# Patient Record
Sex: Female | Born: 1965 | Race: White | Hispanic: No | Marital: Married | Smoking: Former smoker
Health system: Southern US, Community
[De-identification: ages and names within clinical notes are randomized; demographics above are authoritative.]

## PROBLEM LIST (undated history)

## (undated) DIAGNOSIS — T8859XA Other complications of anesthesia, initial encounter: Secondary | ICD-10-CM

## (undated) DIAGNOSIS — G4730 Sleep apnea, unspecified: Secondary | ICD-10-CM

## (undated) DIAGNOSIS — F32A Depression, unspecified: Secondary | ICD-10-CM

## (undated) DIAGNOSIS — H409 Unspecified glaucoma: Secondary | ICD-10-CM

## (undated) DIAGNOSIS — K219 Gastro-esophageal reflux disease without esophagitis: Secondary | ICD-10-CM

## (undated) DIAGNOSIS — F988 Other specified behavioral and emotional disorders with onset usually occurring in childhood and adolescence: Secondary | ICD-10-CM

## (undated) DIAGNOSIS — E7800 Pure hypercholesterolemia, unspecified: Secondary | ICD-10-CM

## (undated) DIAGNOSIS — I10 Essential (primary) hypertension: Secondary | ICD-10-CM

## (undated) DIAGNOSIS — F329 Major depressive disorder, single episode, unspecified: Secondary | ICD-10-CM

## (undated) DIAGNOSIS — T4145XA Adverse effect of unspecified anesthetic, initial encounter: Secondary | ICD-10-CM

## (undated) DIAGNOSIS — R7303 Prediabetes: Secondary | ICD-10-CM

## (undated) DIAGNOSIS — F419 Anxiety disorder, unspecified: Secondary | ICD-10-CM

## (undated) DIAGNOSIS — E039 Hypothyroidism, unspecified: Secondary | ICD-10-CM

## (undated) HISTORY — PX: CHOLECYSTECTOMY: SHX55

## (undated) HISTORY — PX: CARPAL TUNNEL RELEASE: SHX101

---

## 1997-08-04 ENCOUNTER — Other Ambulatory Visit: Admission: RE | Admit: 1997-08-04 | Discharge: 1997-08-04 | Payer: Self-pay | Admitting: Obstetrics and Gynecology

## 1997-09-25 ENCOUNTER — Inpatient Hospital Stay (HOSPITAL_COMMUNITY): Admission: AD | Admit: 1997-09-25 | Discharge: 1997-09-25 | Payer: Self-pay | Admitting: Obstetrics and Gynecology

## 1997-10-16 ENCOUNTER — Inpatient Hospital Stay (HOSPITAL_COMMUNITY): Admission: AD | Admit: 1997-10-16 | Discharge: 1997-10-18 | Payer: Self-pay | Admitting: *Deleted

## 1997-10-16 ENCOUNTER — Inpatient Hospital Stay (HOSPITAL_COMMUNITY): Admission: AD | Admit: 1997-10-16 | Discharge: 1997-10-16 | Payer: Self-pay | Admitting: *Deleted

## 1997-11-22 ENCOUNTER — Other Ambulatory Visit: Admission: RE | Admit: 1997-11-22 | Discharge: 1997-11-22 | Payer: Self-pay | Admitting: Obstetrics and Gynecology

## 1998-03-18 ENCOUNTER — Other Ambulatory Visit: Admission: RE | Admit: 1998-03-18 | Discharge: 1998-03-18 | Payer: Self-pay | Admitting: Obstetrics and Gynecology

## 1998-10-19 ENCOUNTER — Other Ambulatory Visit: Admission: RE | Admit: 1998-10-19 | Discharge: 1998-10-19 | Payer: Self-pay | Admitting: Obstetrics and Gynecology

## 1999-05-04 ENCOUNTER — Other Ambulatory Visit: Admission: RE | Admit: 1999-05-04 | Discharge: 1999-05-04 | Payer: Self-pay | Admitting: Obstetrics and Gynecology

## 1999-09-22 ENCOUNTER — Encounter: Payer: Self-pay | Admitting: Family Medicine

## 1999-09-22 ENCOUNTER — Encounter: Admission: RE | Admit: 1999-09-22 | Discharge: 1999-09-22 | Payer: Self-pay | Admitting: Family Medicine

## 1999-09-29 ENCOUNTER — Encounter (INDEPENDENT_AMBULATORY_CARE_PROVIDER_SITE_OTHER): Payer: Self-pay | Admitting: *Deleted

## 1999-09-29 ENCOUNTER — Ambulatory Visit (HOSPITAL_COMMUNITY): Admission: RE | Admit: 1999-09-29 | Discharge: 1999-09-30 | Payer: Self-pay

## 1999-11-30 ENCOUNTER — Other Ambulatory Visit: Admission: RE | Admit: 1999-11-30 | Discharge: 1999-11-30 | Payer: Self-pay | Admitting: Obstetrics and Gynecology

## 2000-08-01 ENCOUNTER — Encounter: Payer: Self-pay | Admitting: Family Medicine

## 2000-08-01 ENCOUNTER — Encounter: Admission: RE | Admit: 2000-08-01 | Discharge: 2000-08-01 | Payer: Self-pay | Admitting: Family Medicine

## 2000-08-21 ENCOUNTER — Other Ambulatory Visit: Admission: RE | Admit: 2000-08-21 | Discharge: 2000-08-21 | Payer: Self-pay | Admitting: Obstetrics and Gynecology

## 2001-06-19 ENCOUNTER — Other Ambulatory Visit: Admission: RE | Admit: 2001-06-19 | Discharge: 2001-06-19 | Payer: Self-pay | Admitting: Obstetrics and Gynecology

## 2002-12-07 ENCOUNTER — Other Ambulatory Visit: Admission: RE | Admit: 2002-12-07 | Discharge: 2002-12-07 | Payer: Self-pay | Admitting: Obstetrics and Gynecology

## 2003-01-19 ENCOUNTER — Encounter: Admission: RE | Admit: 2003-01-19 | Discharge: 2003-01-19 | Payer: Self-pay | Admitting: Obstetrics and Gynecology

## 2003-01-19 ENCOUNTER — Encounter: Payer: Self-pay | Admitting: Obstetrics and Gynecology

## 2004-02-25 ENCOUNTER — Other Ambulatory Visit: Admission: RE | Admit: 2004-02-25 | Discharge: 2004-02-25 | Payer: Self-pay | Admitting: Obstetrics and Gynecology

## 2005-07-31 ENCOUNTER — Other Ambulatory Visit: Admission: RE | Admit: 2005-07-31 | Discharge: 2005-07-31 | Payer: Self-pay | Admitting: Obstetrics and Gynecology

## 2007-05-11 ENCOUNTER — Emergency Department (HOSPITAL_COMMUNITY): Admission: EM | Admit: 2007-05-11 | Discharge: 2007-05-11 | Payer: Self-pay | Admitting: Emergency Medicine

## 2008-07-26 ENCOUNTER — Other Ambulatory Visit (HOSPITAL_COMMUNITY): Admission: RE | Admit: 2008-07-26 | Discharge: 2008-08-16 | Payer: Self-pay | Admitting: Psychiatry

## 2008-08-03 ENCOUNTER — Ambulatory Visit: Payer: Self-pay | Admitting: Psychiatry

## 2009-07-20 ENCOUNTER — Encounter: Admission: RE | Admit: 2009-07-20 | Discharge: 2009-07-20 | Payer: Self-pay | Admitting: Obstetrics and Gynecology

## 2010-01-24 ENCOUNTER — Encounter: Admission: RE | Admit: 2010-01-24 | Discharge: 2010-01-24 | Payer: Self-pay | Admitting: Obstetrics and Gynecology

## 2010-04-30 ENCOUNTER — Encounter: Payer: Self-pay | Admitting: Obstetrics and Gynecology

## 2010-08-25 NOTE — Op Note (Signed)
Ashley Bright. [facility]  Patient:    Ashley Bright, Ashley Bright                         MRN: [id number] Proc. Date: 09/29/99 Adm. Date:  [id number] Disc. Date: [id number] Attending:  Gennie Bright CC:         Ashley Bright, M.D.                           Operative Report  [facility] [place] NUMBER:  [phone].  PREOPERATIVE DIAGNOSES:  Chronic cholecystitis and cholelithiasis.  POSTOPERATIVE DIAGNOSES:  Chronic cholecystitis, cholelithiasis and umbilical hernia.  OPERATION:  Laparoscopic cholecystectomy, intraoperative cholangiogram and repair of umbilical hernia.  SURGEON:  Milus Mallick, M.D.  ASSISTANT:  Currie Paris, M.D.  ANESTHESIA:  General endotracheal.  DESCRIPTION OF PROCEDURE:  Under adequate general endotracheal anesthesia, the patients abdomen was prepared and draped in the usual fashion.  A short vertical incision was made in the inferior portion of the umbilicus and at that point, one could see that the patient had an umbilical hernia that was chronically incarcerated at the superior end of the incision.  Holding sutures of 0 Vicryl were applied to the fascial edges and the fascia was incised longitudinally and up to the hernia.  The peritoneum was entered and a Hasson cannula was introduced into the abdomen under direct vision.  The abdomen was inflated with the CO2 to a filling pressure of 15 mmHg.  The laparoscopic videoscope was then introduced.  Using this for direct vision, a 10-mm upper midline port was inserted as well as two subcostal 5-mm ports.  The patient was placed in a reversed Trendelenburg position and turned to the left.  The gallbladder was grasped with grasping forceps placed down through the subcostal ports.  It was chronically inflamed and thick-walled and had some adhesions on it.  The adhesions were taken down bluntly, exposing the triangle of Calot.  Dissection was carried in the triangle of Calot and the cystic  duct was isolated and skeletonized.  It was clipped at the cystic duct-gallbladder junction.  A small incision was made in the cystic duct and golden bile exited from it.  A 14-gauge Angiocath was then introduced into the abdomen and a Reddick catheter was introduced through the Angiocath.  It was threaded into the cystic duct and held in place with an Endo Clip.  An intraoperative cholangiogram was then done using 10 cc of 25% Hypaque; this revealed a normal-size common bile duct and no intraluminal filling defects.  There was good flow of dye into the duodenum.  Following the cholangiogram, the clip was removed and the catheter was withdrawn from the cystic duct and removed from the abdomen.  The cystic duct was triply clipped with Endo Clips and transected above the triple-clip application.  The cystic artery was then triply clipped with Endo Clips and transected between the distal two clips. The gallbladder was then dissected out of the gallbladder bed of the liver using hook-and-spatula coagulation.  There was some bleeding from the gallbladder bed and this was controlled with electrocoagulation.  The gallbladder was completely removed.  The subhepatic and subphrenic spaces were irrigated with sterile solution till clear.  The gallbladder was then withdrawn from the abdomen through the umbilical port.  The fundic portion continued to have multiple stones in it and three-quarters of the gallbladder could be removed from the abdomen.  The portion of gallbladder that was outside of the abdomen was incised.  Bile and multiple small stones exited. The stones impacted in the fundus of the gallbladder were extracted and then it was small enough to slip through the umbilical port incision and be removed.  The subcostal ports were then withdrawn and there was no bleeding from the port sites.  The pneumoperitoneum was exhausted and the umbilical hernia was repaired.  The umbilical skin was  dissected off of it and it was excised over hemostats which were ligated with 3-0 Vicryl.  The upper end of the fascial defect was ascertained.  The resulting opening in the fascia was approximately 3 cm in length.  It was closed longitudinally with interrupted sutures of 0 PDS for repair of the umbilical hernia and closure of the port incision.  A pneumoperitoneum was then reinstituted and the undersurface of the umbilical hernia closure was inspected laparoscopically and there were no adhesions to it.  The pneumoperitoneum was once again exhausted and the camera was removed and the upper midline port removed.  The subcuticular layer of the umbilical port incision was closed with a continuous suture of 5-0 Vicryl.  The remaining port incision were closed with interrupted subcuticular sutures of 5-0 Vicryl, half-inch Steri-Strips were applied to the skin and sterile dressings were applied.  Estimated blood loss for the procedure was negligible.  Patient tolerated the procedure well and left the operating room in satisfactory condition. DD:  09/29/99 TD:  10/02/99 Job: [id number]

## 2010-10-03 ENCOUNTER — Other Ambulatory Visit: Payer: Self-pay | Admitting: Obstetrics and Gynecology

## 2010-10-03 DIAGNOSIS — N6320 Unspecified lump in the left breast, unspecified quadrant: Secondary | ICD-10-CM

## 2010-10-13 ENCOUNTER — Ambulatory Visit
Admission: RE | Admit: 2010-10-13 | Discharge: 2010-10-13 | Disposition: A | Discharge status: Home / Self Care | Payer: Managed Care, Other (non HMO) | Source: Ambulatory Visit | Attending: Obstetrics and Gynecology | Admitting: Obstetrics and Gynecology

## 2010-10-13 ENCOUNTER — Other Ambulatory Visit: Payer: Self-pay | Admitting: Obstetrics and Gynecology

## 2010-10-13 DIAGNOSIS — N6320 Unspecified lump in the left breast, unspecified quadrant: Secondary | ICD-10-CM

## 2013-03-25 ENCOUNTER — Other Ambulatory Visit: Payer: Self-pay | Admitting: Obstetrics and Gynecology

## 2013-03-25 DIAGNOSIS — R928 Other abnormal and inconclusive findings on diagnostic imaging of breast: Secondary | ICD-10-CM

## 2013-04-07 ENCOUNTER — Other Ambulatory Visit: Payer: Managed Care, Other (non HMO)

## 2013-04-12 ENCOUNTER — Encounter (HOSPITAL_BASED_OUTPATIENT_CLINIC_OR_DEPARTMENT_OTHER): Payer: Self-pay | Admitting: Emergency Medicine

## 2013-04-12 ENCOUNTER — Emergency Department (HOSPITAL_BASED_OUTPATIENT_CLINIC_OR_DEPARTMENT_OTHER)
Admission: EM | Admit: 2013-04-12 | Discharge: 2013-04-12 | Disposition: A | Discharge status: Home / Self Care | Payer: 59 | Attending: Emergency Medicine | Admitting: Emergency Medicine

## 2013-04-12 DIAGNOSIS — Z79899 Other long term (current) drug therapy: Secondary | ICD-10-CM | POA: Insufficient documentation

## 2013-04-12 DIAGNOSIS — I10 Essential (primary) hypertension: Secondary | ICD-10-CM | POA: Insufficient documentation

## 2013-04-12 DIAGNOSIS — F988 Other specified behavioral and emotional disorders with onset usually occurring in childhood and adolescence: Secondary | ICD-10-CM | POA: Insufficient documentation

## 2013-04-12 DIAGNOSIS — F3289 Other specified depressive episodes: Secondary | ICD-10-CM | POA: Insufficient documentation

## 2013-04-12 DIAGNOSIS — K047 Periapical abscess without sinus: Secondary | ICD-10-CM | POA: Insufficient documentation

## 2013-04-12 DIAGNOSIS — E7800 Pure hypercholesterolemia, unspecified: Secondary | ICD-10-CM | POA: Insufficient documentation

## 2013-04-12 DIAGNOSIS — F17200 Nicotine dependence, unspecified, uncomplicated: Secondary | ICD-10-CM | POA: Insufficient documentation

## 2013-04-12 DIAGNOSIS — F329 Major depressive disorder, single episode, unspecified: Secondary | ICD-10-CM | POA: Insufficient documentation

## 2013-04-12 HISTORY — DX: Essential (primary) hypertension: I10

## 2013-04-12 HISTORY — DX: Pure hypercholesterolemia, unspecified: E78.00

## 2013-04-12 HISTORY — DX: Depression, unspecified: F32.A

## 2013-04-12 HISTORY — DX: Other specified behavioral and emotional disorders with onset usually occurring in childhood and adolescence: F98.8

## 2013-04-12 HISTORY — DX: Major depressive disorder, single episode, unspecified: F32.9

## 2013-04-12 MED ORDER — CLINDAMYCIN PHOSPHATE 900 MG/6ML IJ SOLN
600.0000 mg | Freq: Once | INTRAMUSCULAR | Status: AC
  Administered 2013-04-12: 600 mg via INTRAMUSCULAR
  Filled 2013-04-12: qty 12

## 2013-04-12 MED ORDER — HYDROMORPHONE HCL PF 2 MG/ML IJ SOLN
2.0000 mg | Freq: Once | INTRAMUSCULAR | Status: AC
  Administered 2013-04-12: 2 mg via INTRAMUSCULAR
  Filled 2013-04-12: qty 1

## 2013-04-12 MED ORDER — CLINDAMYCIN PHOSPHATE 900 MG/6ML IJ SOLN
INTRAMUSCULAR | Status: AC
  Filled 2013-04-12: qty 6

## 2013-04-12 MED ORDER — HYDROCODONE-ACETAMINOPHEN 5-325 MG PO TABS: 1.0000 | ORAL_TABLET | Freq: Four times a day (QID) | ORAL | Status: DC | PRN

## 2013-04-12 MED ORDER — CLINDAMYCIN HCL 300 MG PO CAPS: 300.0000 mg | ORAL_CAPSULE | Freq: Four times a day (QID) | ORAL | Status: DC

## 2013-04-12 NOTE — ED Notes (Signed)
No adverse effects noted to IM injection.  

## 2013-04-12 NOTE — Discharge Instructions (Signed)
°  Dental Abscess °A dental abscess is a collection of infected fluid (pus) from a bacterial infection in the inner part of the tooth (pulp). It usually occurs at the end of the tooth's root.  °CAUSES  °· Severe tooth decay. °· Trauma to the tooth that allows bacteria to enter into the pulp, such as a broken or chipped tooth. °SYMPTOMS  °· Severe pain in and around the infected tooth. °· Swelling and redness around the abscessed tooth or in the mouth or face. °· Tenderness. °· Pus drainage. °· Bad breath. °· Bitter taste in the mouth. °· Difficulty swallowing. °· Difficulty opening the mouth. °· Nausea. °· Vomiting. °· Chills. °· Swollen neck glands. °DIAGNOSIS  °· A medical and dental history will be taken. °· An examination will be performed by tapping on the abscessed tooth. °· X-rays may be taken of the tooth to identify the abscess. °TREATMENT °The goal of treatment is to eliminate the infection. You may be prescribed antibiotic medicine to stop the infection from spreading. A root canal may be performed to save the tooth. If the tooth cannot be saved, it may be pulled (extracted) and the abscess may be drained.  °HOME CARE INSTRUCTIONS °· Only take over-the-counter or prescription medicines for pain, fever, or discomfort as directed by your caregiver. °· Rinse your mouth (gargle) often with salt water (¼ tsp salt in 8 oz [250 ml] of warm water) to relieve pain or swelling. °· Do not drive after taking pain medicine (narcotics). °· Do not apply heat to the outside of your face. °· Return to your dentist for further treatment as directed. °SEEK MEDICAL CARE IF: °· Your pain is not helped by medicine. °· Your pain is getting worse instead of better. °SEEK IMMEDIATE MEDICAL CARE IF: °· You have a fever or persistent symptoms for more than 2 3 days. °· You have a fever and your symptoms suddenly get worse. °· You have chills or a very bad headache. °· You have problems breathing or swallowing. °· You have trouble  opening your mouth. °· You have swelling in the neck or around the eye. °Document Released: 03/26/2005 Document Revised: 12/19/2011 Document Reviewed: 07/04/2010 °ExitCare® Patient Information ©2014 [employer]. ° ° °

## 2013-04-12 NOTE — ED Provider Notes (Signed)
CSN: [id number]     Arrival date & time 04/12/13  0129 History   First MD Initiated Contact with Patient 04/12/13 [phone]     Chief Complaint  Patient presents with  . Dental Pain   (Consider location/radiation/quality/duration/timing/severity/associated sxs/prior Treatment) HPI This is a 48 year old female with a five-day history of pain in her left lower first molar. She had been treating it with over-the-counter analgesics without relief. Yesterday she developed swelling associated with that tooth and her left cheek is now significantly swollen and tender. There is moderate to severe pain, worse with palpation or movement of the jaw. She is not aware of having a fever but has had some malaise. She has a dentist whom she will contact tomorrow morning. She's not having any difficulty breathing or swallowing.  Past Medical History  Diagnosis Date  . Hypertension   . Depression   . ADD (attention deficit disorder)   . High cholesterol    Past Surgical History  Procedure Laterality Date  . Cholecystectomy     No family history on file. History  Substance Use Topics  . Smoking status: Current Every Day Smoker  . Smokeless tobacco: Not on file  . Alcohol Use: No   OB History   Grav Para Term Preterm Abortions TAB SAB Ect Mult Living                 Review of Systems  All other systems reviewed and are negative.    Allergies  Review of patient's allergies indicates no known allergies.  Home Medications   Current Outpatient Rx  Name  Route  Sig  Dispense  Refill  . ALPRAZolam (XANAX) 1 MG tablet   Oral   Take 1 mg by mouth 3 (three) times daily as needed for anxiety.         Marland Kitchen amphetamine-dextroamphetamine (ADDERALL) 30 MG tablet   Oral   Take 30 mg by mouth daily.         . ARIPiprazole (ABILIFY) 2 MG tablet   Oral   Take 2 mg by mouth daily.         . BuPROPion HCl (WELLBUTRIN PO)   Oral   Take by mouth.         . hydrochlorothiazide (HYDRODIURIL) 12.5 MG  tablet   Oral   Take 12.5 mg by mouth daily.         . metoprolol succinate (TOPROL-XL) 100 MG 24 hr tablet   Oral   Take 100 mg by mouth daily. Take with or immediately following a meal.         . omeprazole (PRILOSEC) 20 MG capsule   Oral   Take 20 mg by mouth daily.         . potassium chloride (K-DUR,KLOR-CON) 10 MEQ tablet   Oral   Take 10 mEq by mouth 2 (two) times daily.         . pravastatin (PRAVACHOL) 20 MG tablet   Oral   Take 20 mg by mouth daily.          BP 151/97  Pulse 83  Temp(Src) 98.6 F (37 C) (Oral)  Resp 20  Ht 5\' 3"  (1.6 m)  Wt 165 lb (74.844 kg)  BMI 29.24 kg/m2  SpO2 99%  LMP 04/12/2013  Physical Exam General: Well-developed, well-nourished female in no acute distress; appearance consistent with age of record HENT: normocephalic; atraumatic; multiple dental restorations primarily of molars; swelling and tenderness adjacent to left lower first molar; no  submandibular involvement; no trismus Eyes: pupils equal, round and reactive to light; extraocular muscles intact Neck: supple; no lymphadenopathy Heart: regular rate and rhythm Lungs: clear to auscultation bilaterally Abdomen: soft; nondistended; nontender Extremities: No deformity; full range of motion Neurologic: Awake, alert and oriented; motor function intact in all extremities and symmetric; no facial droop Skin: Warm and dry Psychiatric: Normal mood and affect    ED Course  Procedures (including critical care time)    MDM      Wynetta Fines, MD 04/12/13 [phone]

## 2013-04-12 NOTE — ED Notes (Signed)
C/o left bottom tooth pain that started on Tuesday and states she has been waiting to see her dentist but not able due to holiday. States swelling noted to left lower jaw starting yesterday. Has been taking ibuprofen and tylenol without relief. Denies any fevers. Swelling noted to left side of face on exam.

## 2013-04-14 ENCOUNTER — Other Ambulatory Visit: Payer: Managed Care, Other (non HMO)

## 2013-04-29 ENCOUNTER — Ambulatory Visit
Admission: RE | Admit: 2013-04-29 | Discharge: 2013-04-29 | Disposition: A | Discharge status: Home / Self Care | Payer: 59 | Source: Ambulatory Visit | Attending: Obstetrics and Gynecology | Admitting: Obstetrics and Gynecology

## 2013-04-29 DIAGNOSIS — R928 Other abnormal and inconclusive findings on diagnostic imaging of breast: Secondary | ICD-10-CM

## 2013-12-25 ENCOUNTER — Other Ambulatory Visit: Payer: Self-pay | Admitting: Obstetrics and Gynecology

## 2013-12-25 DIAGNOSIS — N63 Unspecified lump in unspecified breast: Secondary | ICD-10-CM

## 2014-01-01 ENCOUNTER — Ambulatory Visit
Admission: RE | Admit: 2014-01-01 | Discharge: 2014-01-01 | Disposition: A | Discharge status: Home / Self Care | Payer: 59 | Source: Ambulatory Visit | Attending: Obstetrics and Gynecology | Admitting: Obstetrics and Gynecology

## 2014-01-01 ENCOUNTER — Encounter (INDEPENDENT_AMBULATORY_CARE_PROVIDER_SITE_OTHER): Payer: Self-pay

## 2014-01-01 DIAGNOSIS — N63 Unspecified lump in unspecified breast: Secondary | ICD-10-CM

## 2014-06-04 ENCOUNTER — Other Ambulatory Visit: Payer: Self-pay | Admitting: Obstetrics and Gynecology

## 2014-06-04 DIAGNOSIS — R922 Inconclusive mammogram: Secondary | ICD-10-CM

## 2014-06-11 ENCOUNTER — Other Ambulatory Visit: Payer: Self-pay

## 2014-06-15 ENCOUNTER — Ambulatory Visit
Admission: RE | Admit: 2014-06-15 | Discharge: 2014-06-15 | Disposition: A | Discharge status: Home / Self Care | Payer: 59 | Source: Ambulatory Visit | Attending: Obstetrics and Gynecology | Admitting: Obstetrics and Gynecology

## 2014-06-15 DIAGNOSIS — R922 Inconclusive mammogram: Secondary | ICD-10-CM

## 2015-02-02 ENCOUNTER — Other Ambulatory Visit: Payer: Self-pay | Admitting: Orthopaedic Surgery

## 2015-03-10 NOTE — Patient Instructions (Signed)
Ashley Bright  03/10/2015     @PREFPERIOPPHARMACY @   Your procedure is scheduled on  03/15/2015  Report to [facility] at  3  A.M.  Call this number if you have problems the morning of surgery:  [phone]   Remember:  Do not eat food or drink liquids after midnight.  Take these medicines the morning of surgery with A SIP OF WATER : xanax, adderall, abilify, wellbutrin, metoprolol, prilosec.   Do not wear jewelry, make-up or nail polish.  Do not wear lotions, powders, or perfumes.  You may wear deodorant.  Do not shave 48 hours prior to surgery.  Men may shave face and neck.  Do not bring valuables to the hospital.  [facility] is not responsible for any belongings or valuables.  Contacts, dentures or bridgework may not be worn into surgery.  Leave your suitcase in the car.  After surgery it may be brought to your room.  For patients admitted to the hospital, discharge time will be determined by your treatment team.  Patients discharged the day of surgery will not be allowed to drive home.   Name and phone number of your driver:   family Special instructions:  none  Please read over the following fact sheets that you were given. Pain Booklet, Coughing and Deep Breathing, Surgical Site Infection Prevention, Anesthesia Post-op Instructions and Care and Recovery After Surgery      Carpal Tunnel Release Carpal tunnel release is a surgical procedure to relieve numbness and pain in your hand that are caused by carpal tunnel syndrome. Your carpal tunnel is a narrow, hollow space in your wrist. It passes between your wrist bones and a band of connective tissue (transverse carpal ligament). The nerve that supplies most of your hand (median nerve) passes through this space, and so do the connections between your fingers and the muscles of your arm (tendons). Carpal tunnel syndrome makes this space swell and become narrow, and this causes pain and numbness. In carpal tunnel  release surgery, a surgeon cuts through the transverse carpal ligament to make more room in the carpal tunnel space. You may have this surgery if other types of treatment have not worked. LET [facility] CARE PROVIDER KNOW ABOUT:  Any allergies you have.  All medicines you are taking, including vitamins, herbs, eye drops, creams, and over-the-counter medicines.  Previous problems you or members of your family have had with the use of anesthetics.  Any blood disorders you have.  Previous surgeries you have had.  Medical conditions you have. RISKS AND COMPLICATIONS Generally, this is a safe procedure. However, problems may occur, including:  Bleeding.  Infection.  Injury to the median nerve.  Need for additional surgery. BEFORE THE PROCEDURE  Ask your health care provider about:  Changing or stopping your regular medicines. This is especially important if you are taking diabetes medicines or blood thinners.  Taking medicines such as aspirin and ibuprofen. These medicines can thin your blood. Do not take these medicines before your procedure if your health care provider instructs you not to.  Do not eat or drink anything after midnight on the night before the procedure or as directed by your health care provider.  Plan to have someone take you home after the procedure. PROCEDURE  An IV tube may be inserted into a vein.  You will be given one of the following:  A medicine that numbs the wrist area (local anesthetic). You may also be  given a medicine to make you relax (sedative).  A medicine that makes you go to sleep (general anesthetic).  Your arm, hand, and wrist will be cleaned with a germ-killing solution (antiseptic).  Your surgeon will make a surgical cut (incision) over the palm side of your wrist. The surgeon will pull aside the skin of your wrist to expose the carpal tunnel space.  The surgeon will cut the transverse carpal ligament.  The edges of the incision  will be closed with stitches (sutures) or staples.  A bandage (dressing) will be placed over your wrist and wrapped around your hand and wrist. AFTER THE PROCEDURE  You may spend some time in a recovery area.  Your blood pressure, heart rate, breathing rate, and blood oxygen level will be monitored often until the medicines you were given have worn off.  You will likely have some pain. You will be given pain medicine.  You may need to wear a splint or a wrist brace over your dressing.   This information is not intended to replace advice given to you by your health care provider. Make sure you discuss any questions you have with your health care provider.   Document Released: 06/16/2003 Document Revised: 04/16/2014 Document Reviewed: 11/11/2013 Elsevier Interactive Patient Education 2016 [employer] PATIENT INSTRUCTIONS POST-ANESTHESIA  IMMEDIATELY FOLLOWING SURGERY:  Do not drive or operate machinery for the first twenty four hours after surgery.  Do not make any important decisions for twenty four hours after surgery or while taking narcotic pain medications or sedatives.  If you develop intractable nausea and vomiting or a severe headache please notify your doctor immediately.  FOLLOW-UP:  Please make an appointment with your surgeon as instructed. You do not need to follow up with anesthesia unless specifically instructed to do so.  WOUND CARE INSTRUCTIONS (if applicable):  Keep a dry clean dressing on the anesthesia/puncture wound site if there is drainage.  Once the wound has quit draining you may leave it open to air.  Generally you should leave the bandage intact for twenty four hours unless there is drainage.  If the epidural site drains for more than 36-48 hours please call the anesthesia department.  QUESTIONS?:  Please feel free to call your physician or the hospital operator if you have any questions, and they will be happy to assist you.

## 2015-03-11 ENCOUNTER — Encounter (HOSPITAL_COMMUNITY): Payer: Self-pay

## 2015-03-11 ENCOUNTER — Other Ambulatory Visit: Payer: Self-pay

## 2015-03-11 ENCOUNTER — Encounter (HOSPITAL_COMMUNITY)
Admission: RE | Admit: 2015-03-11 | Discharge: 2015-03-11 | Disposition: A | Discharge status: Home / Self Care | Payer: 59 | Source: Ambulatory Visit | Attending: Orthopaedic Surgery | Admitting: Orthopaedic Surgery

## 2015-03-11 DIAGNOSIS — Z01818 Encounter for other preprocedural examination: Secondary | ICD-10-CM | POA: Insufficient documentation

## 2015-03-11 DIAGNOSIS — G5602 Carpal tunnel syndrome, left upper limb: Secondary | ICD-10-CM | POA: Insufficient documentation

## 2015-03-11 HISTORY — DX: Adverse effect of unspecified anesthetic, initial encounter: T41.45XA

## 2015-03-11 HISTORY — DX: Unspecified glaucoma: H40.9

## 2015-03-11 HISTORY — DX: Other complications of anesthesia, initial encounter: T88.59XA

## 2015-03-11 HISTORY — DX: Anxiety disorder, unspecified: F41.9

## 2015-03-11 HISTORY — DX: Gastro-esophageal reflux disease without esophagitis: K21.9

## 2015-03-11 LAB — COMPREHENSIVE METABOLIC PANEL
ALBUMIN: 4.6 g/dL (ref 3.5–5.0)
ALT: 46 U/L (ref 14–54)
ANION GAP: 12 (ref 5–15)
AST: 30 U/L (ref 15–41)
Alkaline Phosphatase: 67 U/L (ref 38–126)
BILIRUBIN TOTAL: 0.7 mg/dL (ref 0.3–1.2)
BUN: 18 mg/dL (ref 6–20)
CO2: 28 mmol/L (ref 22–32)
Calcium: 10.1 mg/dL (ref 8.9–10.3)
Chloride: 101 mmol/L (ref 101–111)
Creatinine, Ser: 1.26 mg/dL — ABNORMAL HIGH (ref 0.44–1.00)
GFR calc Af Amer: 58 mL/min — ABNORMAL LOW (ref >60)
GFR, EST NON AFRICAN AMERICAN: 50 mL/min — AB (ref >60)
GLUCOSE: 112 mg/dL — AB (ref 65–99)
POTASSIUM: 3 mmol/L — AB (ref 3.5–5.1)
Sodium: 141 mmol/L (ref 135–145)
TOTAL PROTEIN: 7.9 g/dL (ref 6.5–8.1)

## 2015-03-11 LAB — CBC WITH DIFFERENTIAL/PLATELET
BASOS PCT: 1 %
Basophils Absolute: 0.1 10*3/uL (ref 0.0–0.1)
Eosinophils Absolute: 0.2 10*3/uL (ref 0.0–0.7)
Eosinophils Relative: 3 %
HEMATOCRIT: 36.9 % (ref 36.0–46.0)
Hemoglobin: 13.1 g/dL (ref 12.0–15.0)
Lymphocytes Relative: 31 %
Lymphs Abs: 2.9 10*3/uL (ref 0.7–4.0)
MCH: 32.2 pg (ref 26.0–34.0)
MCHC: 35.5 g/dL (ref 30.0–36.0)
MCV: 90.7 fL (ref 78.0–100.0)
MONO ABS: 0.5 10*3/uL (ref 0.1–1.0)
MONOS PCT: 5 %
NEUTROS ABS: 5.6 10*3/uL (ref 1.7–7.7)
Neutrophils Relative %: 60 %
Platelets: 332 10*3/uL (ref 150–400)
RBC: 4.07 MIL/uL (ref 3.87–5.11)
RDW: 13.4 % (ref 11.5–15.5)
WBC: 9.2 10*3/uL (ref 4.0–10.5)

## 2015-03-11 LAB — URINALYSIS, ROUTINE W REFLEX MICROSCOPIC
Bilirubin Urine: NEGATIVE
Glucose, UA: NEGATIVE mg/dL
Hgb urine dipstick: NEGATIVE
Ketones, ur: NEGATIVE mg/dL
NITRITE: NEGATIVE
Specific Gravity, Urine: 1.01 (ref 1.005–1.030)
pH: 6 (ref 5.0–8.0)

## 2015-03-11 LAB — URINE MICROSCOPIC-ADD ON
BACTERIA UA: NONE SEEN
RBC / HPF: NONE SEEN RBC/hpf (ref 0–5)

## 2015-03-11 NOTE — Pre-Procedure Instructions (Signed)
Patient given information to sign up for my chart at home. 

## 2015-03-14 NOTE — H&P (Signed)
Ashley Bright is an 49 y.o. female.   Chief Complaint: carpal tunnel syndrome left  HPI: she has history of bilateral carpal tunnel syndrome.  She had EMGs done in April showing bilateral carpal tunnel.  She had surgery in [place], [place] in May.  She has moved to this area now.  She has numbness, night pain of the left wrist and hand in the median nerve distribution.  She would like to have the carpal tunnel release on the left hand now.  She understands the risks and imponderables. She knows this is an elective procedure.  Past Medical History  Diagnosis Date  . Hypertension   . Depression   . ADD (attention deficit disorder)   . High cholesterol   . Complication of anesthesia     pt states after her cholecystectomy her mouth got really sore and red. She thought it was thrush but MD thought she was allergic to something on tube.  . Anxiety   . Glaucoma   . GERD (gastroesophageal reflux disease)     Past Surgical History  Procedure Laterality Date  . Cholecystectomy    . Carpal tunnel release Right     No family history on file. Social History:  reports that she quit smoking about 11 months ago. Her smoking use included Cigarettes. She has a 31 pack-year smoking history. She does not have any smokeless tobacco history on file. She reports that she drinks alcohol. She reports that she does not use illicit drugs.  Allergies: No Known Allergies  No prescriptions prior to admission    No results found for this or any previous visit (from the past 48 hour(s)). No results found.  Review of Systems  Gastrointestinal: Positive for heartburn.  Musculoskeletal: Positive for joint pain (Pain left hand median nerve distribution.  Prior surgery right carpal tunnel in May in [place].).    There were no vitals taken for this visit. Physical Exam  Constitutional: She is oriented to person, place, and time. She appears well-developed and well-nourished.  HENT:  Head: Normocephalic and  atraumatic.  Eyes: Conjunctivae and EOM are normal. Pupils are equal, round, and reactive to light.  Neck: Normal range of motion. Neck supple.  Cardiovascular: Normal rate, regular rhythm, normal heart sounds and intact distal pulses.   Respiratory: Effort normal and breath sounds normal.  GI: Soft.  Musculoskeletal: She exhibits tenderness (Decreased sensation left median nerve, positive Phalen and Tinel left.  Scar right right post CT release.).  Neurological: She is alert and oriented to person, place, and time. She has normal reflexes.  Skin: Skin is warm and dry.  Psychiatric: She has a normal mood and affect. Her behavior is normal. Judgment and thought content normal.     Assessment/Plan Carpal tunnel syndrome left, for outpatient elective surgery. Prior release of the right wrist for carpal tunnel.  Ashley Bright 03/14/2015, 11:00 AM

## 2015-03-15 ENCOUNTER — Encounter (HOSPITAL_COMMUNITY)
Admission: RE | Disposition: A | Discharge status: Home / Self Care | Payer: Self-pay | Source: Ambulatory Visit | Attending: Orthopaedic Surgery

## 2015-03-15 ENCOUNTER — Ambulatory Visit (HOSPITAL_COMMUNITY)
Admission: RE | Admit: 2015-03-15 | Discharge: 2015-03-15 | Disposition: A | Discharge status: Home / Self Care | Payer: 59 | Source: Ambulatory Visit | Attending: Orthopaedic Surgery | Admitting: Orthopaedic Surgery

## 2015-03-15 ENCOUNTER — Ambulatory Visit (HOSPITAL_COMMUNITY): Payer: 59 | Admitting: Anesthesiology

## 2015-03-15 ENCOUNTER — Encounter (HOSPITAL_COMMUNITY): Payer: Self-pay | Admitting: *Deleted

## 2015-03-15 DIAGNOSIS — I10 Essential (primary) hypertension: Secondary | ICD-10-CM | POA: Insufficient documentation

## 2015-03-15 DIAGNOSIS — Z87891 Personal history of nicotine dependence: Secondary | ICD-10-CM | POA: Insufficient documentation

## 2015-03-15 DIAGNOSIS — F419 Anxiety disorder, unspecified: Secondary | ICD-10-CM | POA: Insufficient documentation

## 2015-03-15 DIAGNOSIS — K219 Gastro-esophageal reflux disease without esophagitis: Secondary | ICD-10-CM | POA: Insufficient documentation

## 2015-03-15 DIAGNOSIS — Z7982 Long term (current) use of aspirin: Secondary | ICD-10-CM | POA: Diagnosis not present

## 2015-03-15 DIAGNOSIS — G5602 Carpal tunnel syndrome, left upper limb: Secondary | ICD-10-CM | POA: Insufficient documentation

## 2015-03-15 DIAGNOSIS — F329 Major depressive disorder, single episode, unspecified: Secondary | ICD-10-CM | POA: Insufficient documentation

## 2015-03-15 DIAGNOSIS — E669 Obesity, unspecified: Secondary | ICD-10-CM | POA: Insufficient documentation

## 2015-03-15 DIAGNOSIS — Z79899 Other long term (current) drug therapy: Secondary | ICD-10-CM | POA: Diagnosis not present

## 2015-03-15 DIAGNOSIS — E7800 Pure hypercholesterolemia, unspecified: Secondary | ICD-10-CM | POA: Diagnosis not present

## 2015-03-15 HISTORY — PX: CARPAL TUNNEL RELEASE: SHX101

## 2015-03-15 SURGERY — CARPAL TUNNEL RELEASE
Anesthesia: Regional | Laterality: Left

## 2015-03-15 MED ORDER — PROPOFOL 10 MG/ML IV BOLUS
INTRAVENOUS | Status: AC
  Filled 2015-03-15: qty 40

## 2015-03-15 MED ORDER — PROPOFOL 500 MG/50ML IV EMUL
INTRAVENOUS | Status: DC | PRN
  Administered 2015-03-15: 150 ug/kg/min via INTRAVENOUS

## 2015-03-15 MED ORDER — LACTATED RINGERS IV SOLN
INTRAVENOUS | Status: DC
  Administered 2015-03-15: 1000 mL via INTRAVENOUS

## 2015-03-15 MED ORDER — FENTANYL CITRATE (PF) 100 MCG/2ML IJ SOLN
INTRAMUSCULAR | Status: AC
  Filled 2015-03-15: qty 2

## 2015-03-15 MED ORDER — EPHEDRINE SULFATE 50 MG/ML IJ SOLN
INTRAMUSCULAR | Status: AC
  Filled 2015-03-15: qty 1

## 2015-03-15 MED ORDER — SODIUM CHLORIDE 0.9 % IJ SOLN
INTRAMUSCULAR | Status: DC | PRN
  Administered 2015-03-15: 3 mL via INTRAVENOUS

## 2015-03-15 MED ORDER — CHLORHEXIDINE GLUCONATE 4 % EX LIQD: 60.0000 mL | Freq: Once | CUTANEOUS | Status: DC

## 2015-03-15 MED ORDER — SODIUM CHLORIDE 0.9 % IJ SOLN
INTRAMUSCULAR | Status: AC
  Filled 2015-03-15: qty 10

## 2015-03-15 MED ORDER — MIDAZOLAM HCL 2 MG/2ML IJ SOLN
INTRAMUSCULAR | Status: AC
  Filled 2015-03-15: qty 2

## 2015-03-15 MED ORDER — FENTANYL CITRATE (PF) 100 MCG/2ML IJ SOLN
INTRAMUSCULAR | Status: DC | PRN
  Administered 2015-03-15: 50 ug via INTRAVENOUS
  Administered 2015-03-15 (×2): 25 ug via INTRAVENOUS

## 2015-03-15 MED ORDER — ONDANSETRON HCL 4 MG/2ML IJ SOLN: 4.0000 mg | Freq: Once | INTRAMUSCULAR | Status: DC | PRN

## 2015-03-15 MED ORDER — FENTANYL CITRATE (PF) 100 MCG/2ML IJ SOLN: 25.0000 ug | INTRAMUSCULAR | Status: DC | PRN

## 2015-03-15 MED ORDER — MIDAZOLAM HCL 2 MG/2ML IJ SOLN
1.0000 mg | INTRAMUSCULAR | Status: DC | PRN
  Administered 2015-03-15 (×2): 2 mg via INTRAVENOUS

## 2015-03-15 MED ORDER — SODIUM CHLORIDE 0.9 % IR SOLN
Status: DC | PRN
  Administered 2015-03-15: 500 mL

## 2015-03-15 MED ORDER — FENTANYL CITRATE (PF) 100 MCG/2ML IJ SOLN
25.0000 ug | INTRAMUSCULAR | Status: DC | PRN
  Administered 2015-03-15 (×4): 50 ug via INTRAVENOUS
  Filled 2015-03-15: qty 2

## 2015-03-15 MED ORDER — LIDOCAINE HCL (PF) 0.5 % IJ SOLN
INTRAMUSCULAR | Status: AC
  Filled 2015-03-15: qty 50

## 2015-03-15 MED ORDER — LIDOCAINE HCL (CARDIAC) 10 MG/ML IV SOLN
INTRAVENOUS | Status: DC | PRN
  Administered 2015-03-15: 50 mg via INTRAVENOUS

## 2015-03-15 MED ORDER — LIDOCAINE HCL (PF) 1 % IJ SOLN
INTRAMUSCULAR | Status: AC
  Filled 2015-03-15: qty 5

## 2015-03-15 MED ORDER — LIDOCAINE HCL (PF) 0.5 % IJ SOLN
INTRAMUSCULAR | Status: DC | PRN
  Administered 2015-03-15: 50 mL via INTRAVENOUS

## 2015-03-15 MED ORDER — HYDROCODONE-ACETAMINOPHEN 7.5-325 MG PO TABS: 1.0000 | ORAL_TABLET | ORAL | Status: DC | PRN

## 2015-03-15 SURGICAL SUPPLY — 45 items
BAG HAMPER (MISCELLANEOUS) ×3 IMPLANT
BANDAGE ACE 3X5.8 VEL STRL LF (GAUZE/BANDAGES/DRESSINGS) ×2 IMPLANT
BANDAGE ELASTIC 3 VELCRO NS (GAUZE/BANDAGES/DRESSINGS) ×3 IMPLANT
BANDAGE ESMARK 4X12 BL STRL LF (DISPOSABLE) ×1 IMPLANT
BLADE 15 SAFETY STRL DISP (BLADE) ×3 IMPLANT
BNDG CMPR 12X4 ELC STRL LF (DISPOSABLE) ×1
BNDG ESMARK 4X12 BLUE STRL LF (DISPOSABLE) ×3
CLOTH BEACON ORANGE TIMEOUT ST (SAFETY) ×3 IMPLANT
COVER LIGHT HANDLE STERIS (MISCELLANEOUS) ×6 IMPLANT
CUFF TOURNIQUET SINGLE 18IN (TOURNIQUET CUFF) ×3 IMPLANT
DRAPE EXTREMITY T 121X128X90 (DRAPE) ×2 IMPLANT
DRSG XEROFORM 1X8 (GAUZE/BANDAGES/DRESSINGS) ×2 IMPLANT
DURAPREP 26ML APPLICATOR (WOUND CARE) ×3 IMPLANT
ELECT NDL TIP 2.8 STRL (NEEDLE) IMPLANT
ELECT NEEDLE TIP 2.8 STRL (NEEDLE) IMPLANT
ELECT REM PT RETURN 9FT ADLT (ELECTROSURGICAL) ×3
ELECTRODE REM PT RTRN 9FT ADLT (ELECTROSURGICAL) ×1 IMPLANT
FORMALIN 10 PREFIL 120ML (MISCELLANEOUS) ×3 IMPLANT
GAUZE SPONGE 4X4 12PLY STRL (GAUZE/BANDAGES/DRESSINGS) ×3 IMPLANT
GLOVE BIO SURGEON STRL SZ8 (GLOVE) ×3 IMPLANT
GLOVE BIO SURGEON STRL SZ8.5 (GLOVE) ×3 IMPLANT
GLOVE BIOGEL PI IND STRL 7.0 (GLOVE) ×1 IMPLANT
GLOVE BIOGEL PI INDICATOR 7.0 (GLOVE) ×2
GLOVE ECLIPSE 6.5 STRL STRAW (GLOVE) ×2 IMPLANT
GLOVE INDICATOR 7.0 STRL GRN (GLOVE) ×2 IMPLANT
GOWN STRL REUS W/TWL LRG LVL3 (GOWN DISPOSABLE) ×6 IMPLANT
GOWN STRL REUS W/TWL XL LVL3 (GOWN DISPOSABLE) ×3 IMPLANT
KIT ROOM TURNOVER APOR (KITS) ×3 IMPLANT
NDL HYPO 18GX1.5 BLUNT FILL (NEEDLE) ×1 IMPLANT
NDL HYPO 27GX1-1/4 (NEEDLE) ×1 IMPLANT
NEEDLE HYPO 18GX1.5 BLUNT FILL (NEEDLE) ×3 IMPLANT
NEEDLE HYPO 27GX1-1/4 (NEEDLE) ×3 IMPLANT
NS IRRIG 1000ML POUR BTL (IV SOLUTION) ×3 IMPLANT
PACK BASIC LIMB (CUSTOM PROCEDURE TRAY) ×3 IMPLANT
PAD ARMBOARD 7.5X6 YLW CONV (MISCELLANEOUS) ×3 IMPLANT
PAD CAST 3X4 CTTN HI CHSV (CAST SUPPLIES) ×1 IMPLANT
PADDING CAST COTTON 3X4 STRL (CAST SUPPLIES) ×3
PADDING WEBRIL 4 STERILE (GAUZE/BANDAGES/DRESSINGS) ×2 IMPLANT
SET BASIN LINEN APH (SET/KITS/TRAYS/PACK) ×3 IMPLANT
SPONGE GAUZE 4X4 12PLY (GAUZE/BANDAGES/DRESSINGS) ×1 IMPLANT
STOCKINETTE IMPERVIOUS LG (DRAPES) ×2 IMPLANT
SUT ETHILON 3 0 FSL (SUTURE) ×3 IMPLANT
SYR 3ML LL SCALE MARK (SYRINGE) ×3 IMPLANT
TOWEL OR 17X26 4PK STRL BLUE (TOWEL DISPOSABLE) ×3 IMPLANT
VESSEL LOOPS MAXI RED (MISCELLANEOUS) ×3 IMPLANT

## 2015-03-15 NOTE — Anesthesia Preprocedure Evaluation (Signed)
Anesthesia Evaluation  Patient identified by MRN, date of birth, ID band Patient awake    Reviewed: Allergy & Precautions, NPO status , Patient's Chart, lab work & pertinent test results, reviewed documented beta blocker date and time   Airway Mallampati: II       Dental  (+) Missing, Poor Dentition,    Pulmonary former smoker,    Pulmonary exam normal        Cardiovascular hypertension, Pt. on medications and Pt. on home beta blockers Normal cardiovascular exam     Neuro/Psych Anxiety Depression    GI/Hepatic GERD  ,  Endo/Other    Renal/GU      Musculoskeletal   Abdominal (+) + obese,   Peds  Hematology   Anesthesia Other Findings   Reproductive/Obstetrics                             Anesthesia Physical Anesthesia Plan  ASA: III  Anesthesia Plan: Bier Block   Post-op Pain Management:    Induction:   Airway Management Planned: Nasal Cannula  Additional Equipment:   Intra-op Plan:   Post-operative Plan:   Informed Consent: I have reviewed the patients History and Physical, chart, labs and discussed the procedure including the risks, benefits and alternatives for the proposed anesthesia with the patient or authorized representative who has indicated his/her understanding and acceptance.   Dental advisory given  Plan Discussed with: CRNA  Anesthesia Plan Comments:         Anesthesia Quick Evaluation

## 2015-03-15 NOTE — Anesthesia Procedure Notes (Signed)
Procedure Name: MAC Date/Time: 03/15/2015 7:29 AM Performed by: Vista Deck Pre-anesthesia Checklist: Patient identified, Emergency Drugs available, Suction available, Timeout performed and Patient being monitored Patient Re-evaluated:Patient Re-evaluated prior to inductionOxygen Delivery Method: Non-rebreather mask    Anesthesia Regional Block:  Bier block (IV Regional)  Pre-Anesthetic Checklist: ,, timeout performed, Correct Patient, Correct Site, Correct Laterality, Correct Procedure,, site marked, surgical consent,, at surgeon's request  Laterality: Left     Needles:  Injection technique: Single-shot  Needle Type: Other      Needle Gauge: 22 and 22 G    Additional Needles: Bier block (IV Regional)  Nerve Stimulator or Paresthesia:   Additional Responses:  Pulse checked post tourniquet inflation. IV NSL discontinued post injection. Narrative:  Start time: 03/15/2015 7:35 AM End time: 03/15/2015 7:39 AM  Performed by: Personally

## 2015-03-15 NOTE — Transfer of Care (Signed)
Immediate Anesthesia Transfer of Care Note  Patient: Conservation officer, historic buildings  Procedure(s) Performed: Procedure(s): CARPAL TUNNEL RELEASE (Left)  Patient Location: PACU  Anesthesia Type:MAC and Bier block  Level of Consciousness: sedated and patient cooperative  Airway & Oxygen Therapy: Patient Spontanous Breathing and Patient connected to nasal cannula oxygen  Post-op Assessment: Report given to RN, Post -op Vital signs reviewed and stable and Patient moving all extremities  Post vital signs: Reviewed and stable   Complications: No apparent anesthesia complications

## 2015-03-15 NOTE — Anesthesia Postprocedure Evaluation (Signed)
Anesthesia Post Note  Patient: Conservation officer, historic buildings  Procedure(s) Performed: Procedure(s) (LRB): CARPAL TUNNEL RELEASE (Left)  Patient location during evaluation: PACU Anesthesia Type: MAC and Bier Block Level of consciousness: awake and alert Pain management: satisfactory to patient Vital Signs Assessment: post-procedure vital signs reviewed and stable Respiratory status: spontaneous breathing Cardiovascular status: stable Anesthetic complications: no    Last Vitals:  Filed Vitals:   03/15/15 0715 03/15/15 0817  BP: 121/76   Temp:  36.8 C  Resp: 13     Last Pain:  Filed Vitals:   03/15/15 0911  PainSc: 4                  Graciela Plato

## 2015-03-15 NOTE — Discharge Instructions (Signed)
Keep dressing on the left hand and wrist intact and dry.  Do not get wet or remove.  Move fingers often.  Elevate as needed.  See Dr. Luna Glasgow in his office in one week.  If any problem, call his office at [phone] or if after hours, the hospital at [phone].  Carpal Tunnel Release, Care After Refer to this sheet in the next few weeks. These instructions provide you with information about caring for yourself after your procedure. Your health care provider may also give you more specific instructions. Your treatment has been planned according to current medical practices, but problems sometimes occur. Call your health care provider if you have any problems or questions after your procedure. WHAT TO EXPECT AFTER THE PROCEDURE After your procedure, it is typical to have the following:  Pain.  Numbness.  Tingling.  Swelling.  Stiffness.  Bruising. HOME CARE INSTRUCTIONS  Take medicines only as directed by your health care provider.  There are many different ways to close and cover an incision, including stitches (sutures), skin glue, and adhesive strips. Follow your health care provider's instructions about:  Incision care.  Bandage (dressing) changes and removal.  Incision closure removal.  Wear a splint or a brace as directed by your surgeon. You may need to do this for 2-3 weeks.  Keep your hand raised (elevated) above the level of your heart while you are resting. Move your fingers often.  Avoid activities that cause hand pain.  Ask your surgeon when you can start to do all of your usual activities again, such as:  Driving.  Returning to work.  Bathing and swimming.  Keep all follow-up visits as directed by your health care provider. This is important. You may need physical therapy for several months to speed healing and regain movement. SEEK MEDICAL CARE IF:  You have drainage, redness, swelling, or pain at your incision site.  You have a fever.  You  have chills.  Your pain medicine is not working.  Your symptoms do not go away after 2 months.  Your symptoms go away and then return. SEEK IMMEDIATE MEDICAL CARE IF:  You have pain or numbness that is getting worse.  Your fingers change color.  You are not able to move your fingers.   This information is not intended to replace advice given to you by your health care provider. Make sure you discuss any questions you have with your health care provider.   Document Released: 10/13/2004 Document Revised: 04/16/2014 Document Reviewed: 11/11/2013 Elsevier Interactive Patient Education [employer].

## 2015-03-15 NOTE — OR Nursing (Signed)
Patient to PACU. Denies pain at present. Left fingers warm and pink. No numbness or tingling per patient. Brisk capillary refill.

## 2015-03-15 NOTE — Brief Op Note (Signed)
03/15/2015  8:12 AM  PATIENT:  Ashley Bright  49 y.o. female  PRE-OPERATIVE DIAGNOSIS:  left carpal tunnel  POST-OPERATIVE DIAGNOSIS:  left carpal tunnel  PROCEDURE:  Procedure(s): CARPAL TUNNEL RELEASE (Left)  SURGEON:  Surgeon(s) and Role:    * Sanjuana Kava, MD - Primary  PHYSICIAN ASSISTANT:   ASSISTANTS: none   ANESTHESIA:   regional  EBL:  Total I/O In: 600 [I.V.:600] Out: 0   BLOOD ADMINISTERED:none  DRAINS: none   LOCAL MEDICATIONS USED:  NONE  SPECIMEN:  Source of Specimen:  left volar carpal ligament  DISPOSITION OF SPECIMEN:  PATHOLOGY  COUNTS:  YES  TOURNIQUET:   Total Tourniquet Time Documented: Upper Arm (Left) - 32 minutes Total: Upper Arm (Left) - 32 minutes   DICTATION: .Other Dictation: Dictation Number [id number]  PLAN OF CARE: Discharge to home after PACU  PATIENT DISPOSITION:  PACU - hemodynamically stable.   Delay start of Pharmacological VTE agent (>24hrs) due to surgical blood loss or risk of bleeding: not applicable

## 2015-03-15 NOTE — Progress Notes (Signed)
The History and Physical is unchanged. I have examined the patient. The patient is medically able to have surgery on the left volar wrist/hand . Sanjuana Kava

## 2015-03-16 ENCOUNTER — Encounter (HOSPITAL_COMMUNITY): Payer: Self-pay | Admitting: Orthopaedic Surgery

## 2015-03-16 NOTE — Op Note (Signed)
NAMEKALIFA, CAUTHON                   ACCOUNT NO.:  [id number]  MEDICAL RECORD NO.:  [id number]  LOCATION:  [facility]                          FACILITY:  [facility]  PHYSICIAN:  J. Sanjuana Kava, M.D. DATE OF BIRTH:  [birth year]  DATE OF PROCEDURE:  03/15/2015 DATE OF DISCHARGE:  03/15/2015                              OPERATIVE REPORT   PREOPERATIVE DIAGNOSIS:  Carpal tunnel syndrome on the left.  POSTOPERATIVE DIAGNOSIS:  Carpal tunnel syndrome on the left.  PROCEDURE:  Open release of the volar carpal ligament, saline neurolysis upper neurotomy, left median nerve.  ANESTHESIA:  Regional Bier block.  TOURNIQUET TIME:  32 minutes.  DRAINS:  No drains.  SURGEON:  J. Sanjuana Kava, MD  INDICATIONS:  The patient has had carpal tunnel bilaterally.  She had EMGs in April in [place], [place] showing bilateral carpal tunnel.  She had a right carpal tunnel release at that time in [place].  Since then, she has moved to this area and desires to have the surgery on the left hand at this time.  She has had nocturnal numbness, pain, tenderness.  No help with conservative treatment, splints, or medications.  She understands the risks and imponderables which I went over with her preoperatively.  She is elected to have the surgery at this time.  PROCEDURE IN DETAIL:  The patient was seen in the holding area and the left hand was identified as correct surgical site.  I placed a mark on the left wrist.  The patient was brought to the operating room, placed supine on the operating room table with a hand table attached.  She was given regional Bier block anesthesia for left upper extremity.  She was then prepped and draped in the usual manner.  A generalized time-out identifying the left hand as correct surgical site.  We identified the patient as Ms. Heatwole and we are doing carpal tunnel release on the left.  All instrumentation was properly positioned and working.  The OR team knew each  other.  Outline for incision was made with careful dissection, and the median nerve was identified proximally.  Vessel loop placed around the nerve. A grooved director was then used in the volar carpal, space was then incised.  There was marked compression of the nerve.  Retinaculum was cut proximally.  Saline neurolysis was carried out of the nerve and the specimen of volar carpal ligament sent to Pathology.  Nerve was reinspected.  No apparent injury and the wounds were then reapproximated using 3-0 nylon interrupted vertical mattress manner.  Sterile dressing was applied.  Sheet cotton was applied and then cut dorsally.  An Ace bandage was applied loosely.  The patient tolerated the procedure to go to recovery in good condition.  Appropriate analgesia will be given for pain.  If any difficulties, contact me through the office hospital beeper system.          ______________________________ Lenna Sciara. Sanjuana Kava, M.D.     JWK/MEDQ  D:  03/15/2015  T:  03/16/2015  Job:  [id number]

## 2015-03-18 ENCOUNTER — Other Ambulatory Visit (HOSPITAL_COMMUNITY): Payer: 59

## 2015-05-11 ENCOUNTER — Encounter: Payer: Self-pay | Admitting: Orthopaedic Surgery

## 2015-05-11 ENCOUNTER — Ambulatory Visit (INDEPENDENT_AMBULATORY_CARE_PROVIDER_SITE_OTHER): Payer: 59 | Admitting: Orthopaedic Surgery

## 2015-05-11 VITALS — BP 116/80 | HR 81 | Temp 97.5°F | Ht 62.0 in | Wt 172.2 lb

## 2015-05-11 DIAGNOSIS — G5602 Carpal tunnel syndrome, left upper limb: Secondary | ICD-10-CM

## 2015-05-11 NOTE — Progress Notes (Signed)
Subjective:     Patient ID: Ashley Bright, female   DOB: 09-22-1965, 50 y.o.   MRN: [id number]  HPI She is doing very well.  She has no problem.  She has full return of sensation of the left hand.  She is at work full time.   Review of Systems  Musculoskeletal:       Hand pain Lower leg pain Neck Wrist pain   Neurological:       Burning pain in limb Paresthesia   Psychiatric/Behavioral:       Anxiety Depression   All other systems reviewed and are negative.      Objective:   Physical Exam  Normal of left hand now.  Wound well healed.  Full ROM.  NV intact. BP 116/80 mmHg  Pulse 81  Temp(Src) 97.5 F (36.4 C)  Ht 5\' 2"  (1.575 m)  Wt 172 lb 3.2 oz (78.109 kg)  BMI 31.49 kg/m2     Assessment:     Doing well post carpal tunnel left.     Plan:     See as needed.

## 2015-05-11 NOTE — Patient Instructions (Signed)
See as needed 

## 2015-06-22 ENCOUNTER — Other Ambulatory Visit: Payer: Self-pay | Admitting: Obstetrics and Gynecology

## 2015-06-22 DIAGNOSIS — N6002 Solitary cyst of left breast: Secondary | ICD-10-CM

## 2015-07-05 ENCOUNTER — Ambulatory Visit
Admission: RE | Admit: 2015-07-05 | Discharge: 2015-07-05 | Disposition: A | Discharge status: Home / Self Care | Payer: 59 | Source: Ambulatory Visit | Attending: Obstetrics and Gynecology | Admitting: Obstetrics and Gynecology

## 2015-07-05 DIAGNOSIS — N6002 Solitary cyst of left breast: Secondary | ICD-10-CM

## 2015-07-12 ENCOUNTER — Ambulatory Visit: Payer: 59 | Admitting: Orthopaedic Surgery

## 2016-07-18 ENCOUNTER — Other Ambulatory Visit: Payer: Self-pay | Admitting: Obstetrics and Gynecology

## 2016-07-18 DIAGNOSIS — Z1231 Encounter for screening mammogram for malignant neoplasm of breast: Secondary | ICD-10-CM

## 2016-08-09 ENCOUNTER — Ambulatory Visit
Admission: RE | Admit: 2016-08-09 | Discharge: 2016-08-09 | Disposition: A | Discharge status: Home / Self Care | Payer: 59 | Source: Ambulatory Visit | Attending: Obstetrics and Gynecology | Admitting: Obstetrics and Gynecology

## 2016-08-09 DIAGNOSIS — Z1231 Encounter for screening mammogram for malignant neoplasm of breast: Secondary | ICD-10-CM

## 2017-11-13 ENCOUNTER — Encounter: Payer: Self-pay | Admitting: Internal Medicine

## 2017-11-19 ENCOUNTER — Other Ambulatory Visit: Payer: Self-pay | Admitting: Obstetrics and Gynecology

## 2017-11-19 DIAGNOSIS — Z1231 Encounter for screening mammogram for malignant neoplasm of breast: Secondary | ICD-10-CM

## 2017-11-26 ENCOUNTER — Encounter: Payer: Self-pay | Admitting: Nurse Practitioner

## 2017-12-30 ENCOUNTER — Ambulatory Visit: Payer: 59

## 2018-01-31 ENCOUNTER — Ambulatory Visit
Admission: RE | Admit: 2018-01-31 | Discharge: 2018-01-31 | Disposition: A | Discharge status: Home / Self Care | Payer: 59 | Source: Ambulatory Visit | Attending: Obstetrics and Gynecology | Admitting: Obstetrics and Gynecology

## 2018-01-31 DIAGNOSIS — Z1231 Encounter for screening mammogram for malignant neoplasm of breast: Secondary | ICD-10-CM

## 2018-02-04 ENCOUNTER — Other Ambulatory Visit: Payer: Self-pay | Admitting: Obstetrics and Gynecology

## 2018-02-04 DIAGNOSIS — R928 Other abnormal and inconclusive findings on diagnostic imaging of breast: Secondary | ICD-10-CM

## 2018-02-11 ENCOUNTER — Ambulatory Visit: Payer: 59

## 2018-02-11 ENCOUNTER — Other Ambulatory Visit: Payer: Self-pay | Admitting: Obstetrics and Gynecology

## 2018-02-11 ENCOUNTER — Ambulatory Visit
Admission: RE | Admit: 2018-02-11 | Discharge: 2018-02-11 | Disposition: A | Discharge status: Home / Self Care | Payer: 59 | Source: Ambulatory Visit | Attending: Obstetrics and Gynecology | Admitting: Obstetrics and Gynecology

## 2018-02-11 DIAGNOSIS — R928 Other abnormal and inconclusive findings on diagnostic imaging of breast: Secondary | ICD-10-CM

## 2018-02-11 DIAGNOSIS — N6320 Unspecified lump in the left breast, unspecified quadrant: Secondary | ICD-10-CM

## 2018-02-11 DIAGNOSIS — N6310 Unspecified lump in the right breast, unspecified quadrant: Secondary | ICD-10-CM

## 2018-02-14 ENCOUNTER — Ambulatory Visit
Admission: RE | Admit: 2018-02-14 | Discharge: 2018-02-14 | Disposition: A | Discharge status: Home / Self Care | Payer: 59 | Source: Ambulatory Visit | Attending: Obstetrics and Gynecology | Admitting: Obstetrics and Gynecology

## 2018-02-14 ENCOUNTER — Other Ambulatory Visit: Payer: Self-pay | Admitting: Obstetrics and Gynecology

## 2018-02-14 DIAGNOSIS — N6310 Unspecified lump in the right breast, unspecified quadrant: Secondary | ICD-10-CM

## 2018-10-19 ENCOUNTER — Emergency Department (HOSPITAL_COMMUNITY)
Admission: EM | Admit: 2018-10-19 | Discharge: 2018-10-19 | Disposition: A | Discharge status: Home / Self Care | Payer: 59 | Attending: Emergency Medicine | Admitting: Emergency Medicine

## 2018-10-19 ENCOUNTER — Emergency Department (HOSPITAL_COMMUNITY): Payer: 59

## 2018-10-19 ENCOUNTER — Other Ambulatory Visit: Payer: Self-pay

## 2018-10-19 DIAGNOSIS — Z7982 Long term (current) use of aspirin: Secondary | ICD-10-CM | POA: Insufficient documentation

## 2018-10-19 DIAGNOSIS — Z79899 Other long term (current) drug therapy: Secondary | ICD-10-CM | POA: Insufficient documentation

## 2018-10-19 DIAGNOSIS — I10 Essential (primary) hypertension: Secondary | ICD-10-CM | POA: Diagnosis not present

## 2018-10-19 DIAGNOSIS — F17210 Nicotine dependence, cigarettes, uncomplicated: Secondary | ICD-10-CM | POA: Diagnosis not present

## 2018-10-19 DIAGNOSIS — R0602 Shortness of breath: Secondary | ICD-10-CM | POA: Diagnosis present

## 2018-10-19 DIAGNOSIS — M25512 Pain in left shoulder: Secondary | ICD-10-CM

## 2018-10-19 DIAGNOSIS — E876 Hypokalemia: Secondary | ICD-10-CM

## 2018-10-19 LAB — CBC WITH DIFFERENTIAL/PLATELET
Abs Immature Granulocytes: 0.02 10*3/uL (ref 0.00–0.07)
Basophils Absolute: 0.1 10*3/uL (ref 0.0–0.1)
Basophils Relative: 1 %
Eosinophils Absolute: 0.2 10*3/uL (ref 0.0–0.5)
Eosinophils Relative: 2 %
HCT: 31.9 % — ABNORMAL LOW (ref 36.0–46.0)
Hemoglobin: 11.8 g/dL — ABNORMAL LOW (ref 12.0–15.0)
Immature Granulocytes: 0 %
Lymphocytes Relative: 21 %
Lymphs Abs: 1.8 10*3/uL (ref 0.7–4.0)
MCH: 32.7 pg (ref 26.0–34.0)
MCHC: 37 g/dL — ABNORMAL HIGH (ref 30.0–36.0)
MCV: 88.4 fL (ref 80.0–100.0)
Monocytes Absolute: 0.6 10*3/uL (ref 0.1–1.0)
Monocytes Relative: 7 %
Neutro Abs: 5.7 10*3/uL (ref 1.7–7.7)
Neutrophils Relative %: 69 %
Platelets: 302 10*3/uL (ref 150–400)
RBC: 3.61 MIL/uL — ABNORMAL LOW (ref 3.87–5.11)
RDW: 12.9 % (ref 11.5–15.5)
WBC: 8.2 10*3/uL (ref 4.0–10.5)
nRBC: 0 % (ref 0.0–0.2)

## 2018-10-19 LAB — TROPONIN I (HIGH SENSITIVITY)
Troponin I (High Sensitivity): 6 ng/L (ref <18)
Troponin I (High Sensitivity): 7 ng/L (ref <18)

## 2018-10-19 LAB — BASIC METABOLIC PANEL
Anion gap: 12 (ref 5–15)
BUN: 9 mg/dL (ref 6–20)
CO2: 25 mmol/L (ref 22–32)
Calcium: 9 mg/dL (ref 8.9–10.3)
Chloride: 102 mmol/L (ref 98–111)
Creatinine, Ser: 1.02 mg/dL — ABNORMAL HIGH (ref 0.44–1.00)
GFR calc Af Amer: >60 mL/min (ref >60)
GFR calc non Af Amer: >60 mL/min (ref >60)
Glucose, Bld: 102 mg/dL — ABNORMAL HIGH (ref 70–99)
Potassium: 2.4 mmol/L — CL (ref 3.5–5.1)
Sodium: 139 mmol/L (ref 135–145)

## 2018-10-19 MED ORDER — POTASSIUM CHLORIDE 10 MEQ/100ML IV SOLN
10.0000 meq | Freq: Once | INTRAVENOUS | Status: AC
  Administered 2018-10-19: 10 meq via INTRAVENOUS
  Filled 2018-10-19: qty 100

## 2018-10-19 MED ORDER — POTASSIUM CHLORIDE CRYS ER 20 MEQ PO TBCR
40.0000 meq | EXTENDED_RELEASE_TABLET | Freq: Once | ORAL | Status: AC
  Administered 2018-10-19: 40 meq via ORAL
  Filled 2018-10-19: qty 2

## 2018-10-19 MED ORDER — KETOROLAC TROMETHAMINE 15 MG/ML IJ SOLN
15.0000 mg | Freq: Once | INTRAMUSCULAR | Status: AC
  Administered 2018-10-19: 15 mg via INTRAVENOUS
  Filled 2018-10-19: qty 1

## 2018-10-19 NOTE — ED Notes (Signed)
Pt's K+ is 2.4 mmol/L. MD notified.

## 2018-10-19 NOTE — Discharge Instructions (Addendum)
Return for any problem.  Follow-up with your regular care provider as instructed. °

## 2018-10-19 NOTE — ED Triage Notes (Signed)
Pt from home w/ a c/o SOB and left arm/shoulder/back pain. The arm pain began on Wednesday. She believes the arm pain may be related to heavy lifting at work. The pain is intermittent and feels like a soreness. The SOB is with exertion and began yesterday. She does not currently feel SOB. Denies cough. Additional complaints of right leg fluid retention and left leg cramping. No N/V/D.

## 2018-10-19 NOTE — ED Notes (Signed)
Patient verbalizes understanding of discharge instructions. Opportunity for questioning and answers were provided. Armband removed by staff, pt discharged from ED.  

## 2018-10-19 NOTE — ED Provider Notes (Addendum)
[facility]ENCY DEPARTMENT Provider Note   CSN: [id number] Arrival date & time: 10/19/18  1540     History   Chief Complaint Chief Complaint  Patient presents with  . Shortness of Breath  . Arm Pain    HPI Ashley Bright is a 53 y.o. female.     53 year old female with medical history as detailed below presents for evaluation of left shoulder pain and discomfort.  Patient reports 3 to 4 days of pain to the left posterior and superior aspects of her left shoulder.  She reports the pain is worse with movement or with lifting.  She denies any specific inciting injury.  She denies actual chest pain.  She denies shortness of breath.  She denies fever or cough.  She is not reporting any known history of cardiac disease.  The history is provided by the patient and medical records.  Arm Pain This is a new problem. The current episode started more than 2 days ago. The problem occurs constantly. The problem has not changed since onset.Pertinent negatives include no chest pain. Nothing aggravates the symptoms. Nothing relieves the symptoms.    Past Medical History:  Diagnosis Date  . ADD (attention deficit disorder)   . Anxiety   . Complication of anesthesia    pt states after her cholecystectomy her mouth got really sore and red. She thought it was thrush but MD thought she was allergic to something on tube.  . Depression   . GERD (gastroesophageal reflux disease)   . Glaucoma   . High cholesterol   . Hypertension     Patient Active Problem List   Diagnosis Date Noted  . Carpal tunnel syndrome of left wrist 05/11/2015    Past Surgical History:  Procedure Laterality Date  . CARPAL TUNNEL RELEASE Right   . CARPAL TUNNEL RELEASE Left 03/15/2015   Procedure: CARPAL TUNNEL RELEASE;  Surgeon: Sanjuana Kava, MD;  Location: [facility];  Service: Orthopedics;  Laterality: Left;  . CHOLECYSTECTOMY       OB History   No obstetric history on file.      Home  Medications    Prior to Admission medications   Medication Sig Start Date End Date Taking? Authorizing Provider  ALPRAZolam Duanne Moron) 1 MG tablet Take 1 mg by mouth 3 (three) times daily as needed for anxiety.    [provider]  amphetamine-dextroamphetamine (ADDERALL) 30 MG tablet Take 30 mg by mouth 3 (three) times daily.     [provider]  ARIPiprazole (ABILIFY) 2 MG tablet Take 2 mg by mouth daily.    [provider]  aspirin EC 81 MG tablet Take 81 mg by mouth daily.    [provider]  brimonidine (ALPHAGAN P) 0.1 % SOLN Place 1 drop into both eyes 3 (three) times daily.    [provider]  buPROPion (WELLBUTRIN SR) 150 MG 12 hr tablet Take 150 mg by mouth 3 (three) times daily.    [provider]  Calcium Carbonate (CALCIUM 600 PO) Take 1 tablet by mouth daily.    [provider]  cholecalciferol (VITAMIN D) 1000 UNITS tablet Take 5,000 Units by mouth daily.    [provider]  gemfibrozil (LOPID) 600 MG tablet Take 1 tablet by mouth 2 (two) times daily. 01/05/15   [provider]  hydrochlorothiazide (HYDRODIURIL) 12.5 MG tablet Take 12.5 mg by mouth daily.    [provider]  HYDROcodone-acetaminophen (NORCO) 7.5-325 MG tablet Take 1 tablet by  mouth every 4 (four) hours as needed for moderate pain. 03/15/15   Sanjuana Kava, MD  metoprolol succinate (TOPROL-XL) 100 MG 24 hr tablet Take 100 mg by mouth daily. Take with or immediately following a meal.    [provider]  omeprazole (PRILOSEC) 20 MG capsule Take 20 mg by mouth daily.    [provider]  potassium chloride (K-DUR,KLOR-CON) 10 MEQ tablet Take 10 mEq by mouth 2 (two) times daily.    [provider]  pravastatin (PRAVACHOL) 40 MG tablet Take 40 mg by mouth daily.    [provider]    Family History Family History  Problem Relation Age of Onset  . Diabetes Mother   . Heart disease Mother   . Cancer  Father   . Heart attack Brother     Social History Social History   Tobacco Use  . Smoking status: Current Every Day Smoker    Packs/day: 1.00    Years: 31.00    Pack years: 31.00    Types: Cigarettes    Last attempt to quit: 04/09/2014    Years since quitting: 4.5  . Smokeless tobacco: Never Used  Substance Use Topics  . Alcohol use: Yes    Alcohol/week: 0.0 standard drinks    Comment: seldom  . Drug use: No     Allergies   Patient has no known allergies.   Review of Systems Review of Systems  Cardiovascular: Negative for chest pain.  All other systems reviewed and are negative.    Physical Exam Updated Vital Signs BP (!) 156/92   Pulse 79   Temp 98.4 F (36.9 C) (Oral)   Resp 16   SpO2 96%   Physical Exam Vitals signs and nursing note reviewed.  Constitutional:      General: She is not in acute distress.    Appearance: She is well-developed.  HENT:     Head: Normocephalic and atraumatic.  Eyes:     Conjunctiva/sclera: Conjunctivae normal.     Pupils: Pupils are equal, round, and reactive to light.  Neck:     Musculoskeletal: Normal range of motion and neck supple.  Cardiovascular:     Rate and Rhythm: Normal rate and regular rhythm.     Heart sounds: Normal heart sounds.  Pulmonary:     Effort: Pulmonary effort is normal. No respiratory distress.     Breath sounds: Normal breath sounds.  Abdominal:     General: There is no distension.     Palpations: Abdomen is soft.     Tenderness: There is no abdominal tenderness.  Musculoskeletal: Normal range of motion.        General: No deformity.     Comments: Moderate tenderness with palpation to the superior and posterior aspect of the left shoulder.  No discrete step-off noted.  No rash noted.  Distal left upper extremity is neurovascular intact.  Skin:    General: Skin is warm and dry.  Neurological:     Mental Status: She is alert and oriented to person, place, and time.      ED Treatments /  Results  Labs (all labs ordered are listed, but only abnormal results are displayed) Labs Reviewed  BASIC METABOLIC PANEL - Abnormal; Notable for the following components:      Result Value   Potassium 2.4 (*)    Glucose, Bld 102 (*)    Creatinine, Ser 1.02 (*)    All other components within normal limits  CBC WITH DIFFERENTIAL/PLATELET - Abnormal;  Notable for the following components:   RBC 3.61 (*)    Hemoglobin 11.8 (*)    HCT 31.9 (*)    MCHC 37.0 (*)    All other components within normal limits  TROPONIN I (HIGH SENSITIVITY)  TROPONIN I (HIGH SENSITIVITY)    EKG EKG Interpretation  Date/Time:  Sunday October 19 2018 15:51:42 EDT Ventricular Rate:  78 PR Interval:    QRS Duration: 104 QT Interval:  408 QTC Calculation: 465 R Axis:   63 Text Interpretation:  Sinus rhythm Confirmed by Dene Gentry [phone]) on 10/19/2018 3:55:39 PM   Radiology Dg Chest Port 1 View  Result Date: 10/19/2018 CLINICAL DATA:  Shortness of breath and pain EXAM: PORTABLE CHEST 1 VIEW COMPARISON:  None. FINDINGS: Lungs are clear. Heart size and pulmonary vascularity are normal. No adenopathy. No pneumothorax. No bone lesions. IMPRESSION: No edema or consolidation. Electronically Signed   By: Lowella Grip III M.D.   On: 10/19/2018 16:26    Procedures Procedures (including critical care time)  Medications Ordered in ED Medications  potassium chloride 10 mEq in 100 mL IVPB (10 mEq Intravenous New Bag/Given 10/19/18 1826)  ketorolac (TORADOL) 15 MG/ML injection 15 mg (15 mg Intravenous Given 10/19/18 1647)  potassium chloride SA (K-DUR) CR tablet 40 mEq (40 mEq Oral Given 10/19/18 1825)     Initial Impression / Assessment and Plan / ED Course  I have reviewed the triage vital signs and the nursing notes.  Pertinent labs & imaging results that were available during my care of the patient were reviewed by me and considered in my medical decision making (see chart for details).        MDM   Screen complete  Ashley Bright was evaluated in Emergency Department on 10/19/2018 for the symptoms described in the history of present illness. She was evaluated in the context of the global COVID-19 pandemic, which necessitated consideration that the patient might be at risk for infection with the SARS-CoV-2 virus that causes COVID-19. Institutional protocols and algorithms that pertain to the evaluation of patients at risk for COVID-19 are in a state of rapid change based on information released by regulatory bodies including the CDC and federal and state organizations. These policies and algorithms were followed during the patient's care in the ED.  Patient is presenting with complaint of left shoulder pain.  Patient's presenting symptoms are not suggestive of likely ACS.  Patient is however, concerned about the possibility of ACS.  Her initial EKG is without evidence of acute ischemia.  Initial troponin is not significantly elevated.  Repeat troponin is pending.  Patient does feel improved after receiving IV Toradol.  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  She has potassium supplementation at home which she apparently does not take on a regular basis.  Patient appears to be appropriate for discharge.  Importance of close follow-up was stressed return precautions given and understood.    Final Clinical Impressions(s) / ED Diagnoses   Final diagnoses:  Acute pain of left shoulder    ED Discharge Orders    None       Valarie Merino, MD 10/19/18 1903    Valarie Merino, MD 10/19/18 1909

## 2018-10-19 NOTE — ED Notes (Signed)
Pt ambulated to the bathroom and back to room with no difficulties or complaints at this time.

## 2019-05-18 ENCOUNTER — Ambulatory Visit (INDEPENDENT_AMBULATORY_CARE_PROVIDER_SITE_OTHER): Payer: 59 | Admitting: Professional

## 2019-05-18 DIAGNOSIS — F331 Major depressive disorder, recurrent, moderate: Secondary | ICD-10-CM

## 2019-06-02 ENCOUNTER — Ambulatory Visit (INDEPENDENT_AMBULATORY_CARE_PROVIDER_SITE_OTHER): Payer: 59 | Admitting: Professional

## 2019-06-02 DIAGNOSIS — F331 Major depressive disorder, recurrent, moderate: Secondary | ICD-10-CM

## 2019-06-11 ENCOUNTER — Ambulatory Visit (INDEPENDENT_AMBULATORY_CARE_PROVIDER_SITE_OTHER): Payer: 59 | Admitting: Professional

## 2019-06-11 DIAGNOSIS — F331 Major depressive disorder, recurrent, moderate: Secondary | ICD-10-CM

## 2019-06-16 ENCOUNTER — Ambulatory Visit (INDEPENDENT_AMBULATORY_CARE_PROVIDER_SITE_OTHER): Payer: 59 | Admitting: Professional

## 2019-06-16 DIAGNOSIS — F331 Major depressive disorder, recurrent, moderate: Secondary | ICD-10-CM | POA: Diagnosis not present

## 2019-06-25 ENCOUNTER — Ambulatory Visit: Payer: 59 | Admitting: Professional

## 2019-07-10 ENCOUNTER — Ambulatory Visit (INDEPENDENT_AMBULATORY_CARE_PROVIDER_SITE_OTHER): Payer: 59 | Admitting: Professional

## 2019-07-10 DIAGNOSIS — F331 Major depressive disorder, recurrent, moderate: Secondary | ICD-10-CM

## 2019-07-24 ENCOUNTER — Ambulatory Visit: Payer: 59 | Admitting: Professional

## 2019-08-04 ENCOUNTER — Ambulatory Visit: Payer: 59 | Admitting: Professional

## 2019-08-18 ENCOUNTER — Ambulatory Visit: Payer: 59 | Admitting: Professional

## 2019-09-01 ENCOUNTER — Ambulatory Visit: Payer: 59 | Admitting: Professional

## 2019-09-17 IMAGING — MG DIGITAL DIAGNOSTIC BILATERAL MAMMOGRAM WITH TOMO AND CAD
8 series · 8 of 24 positions shown · non-contrast
Comparison: [DATE] [DATE], [DATE], [DATE] [DATE], [DATE], [DATE] [DATE], [DATE], [DATE] [DATE],
[DATE], [DATE] [DATE], [DATE]

Addendum:
CLINICAL DATA: 52-year-old patient recalled from recent screening
mammogram for evaluation of possible bilateral masses.After
evaluation of the right breast with ultrasound today, the patient
had questions about previous ultrasound follow-up to the left breast
in the 9 o'clock region.

The patient tells me that she takes hormone replacement therapy.
EXAM:
DIGITAL DIAGNOSTIC BILATERAL MAMMOGRAM WITH CAD AND TOMO
ULTRASOUND BILATERAL BREAST

[R ML synth-2D]
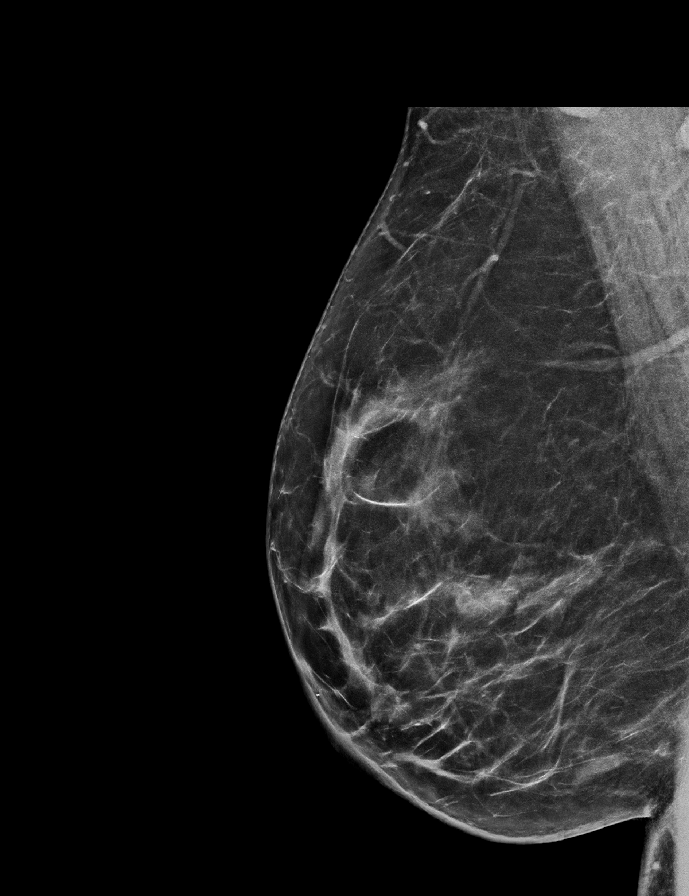

[L ML synth-2D]
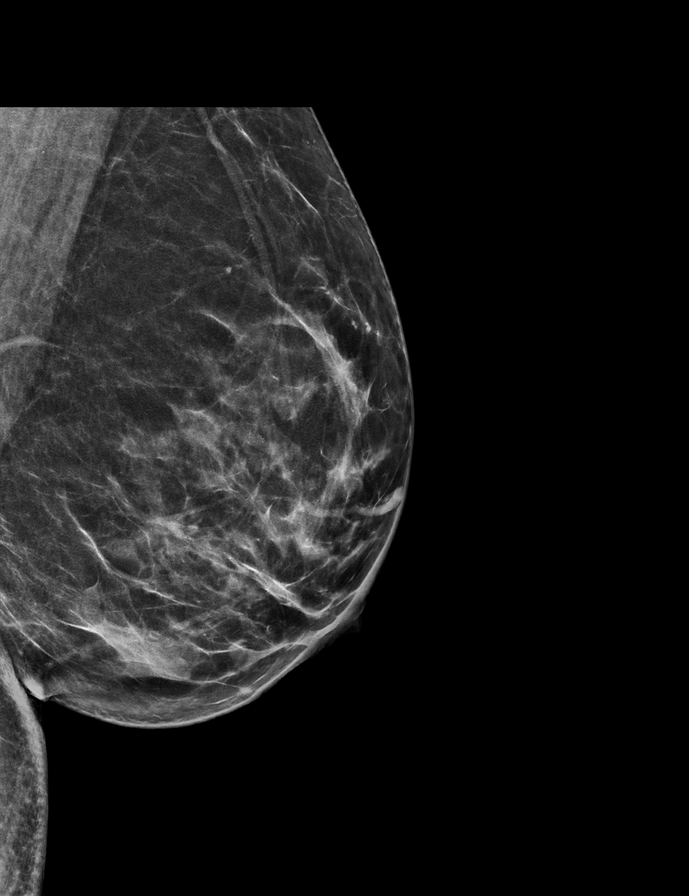

[L MLO synth-2D]
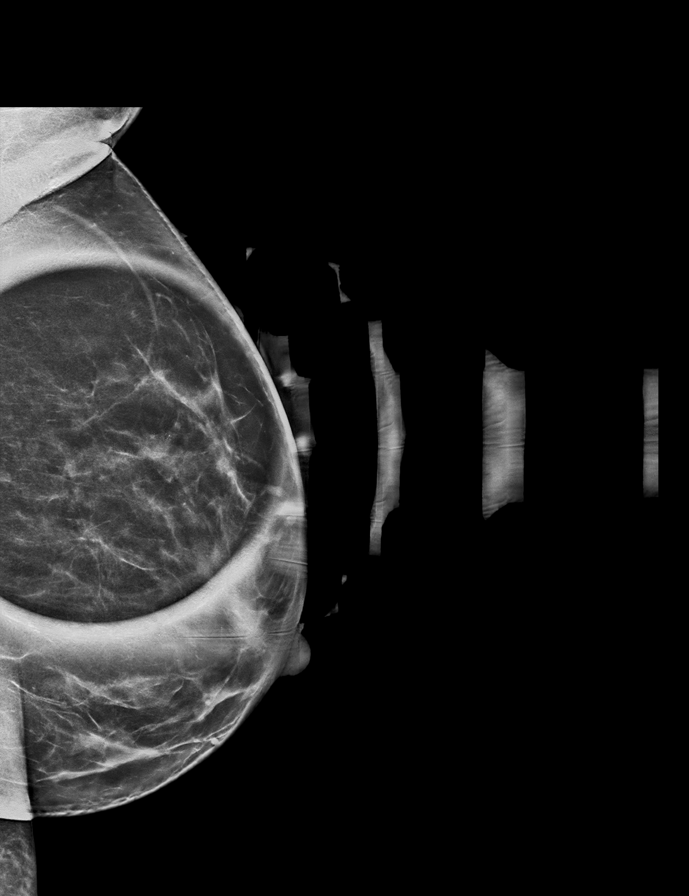

[R CC synth-2D]
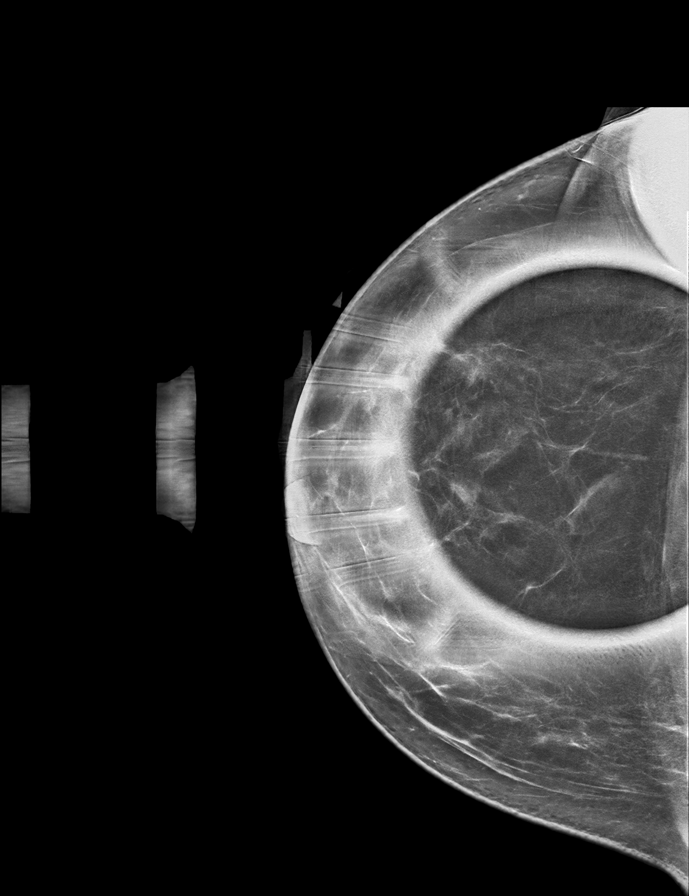

[R CC tomo · tomo slice 39/76.0]
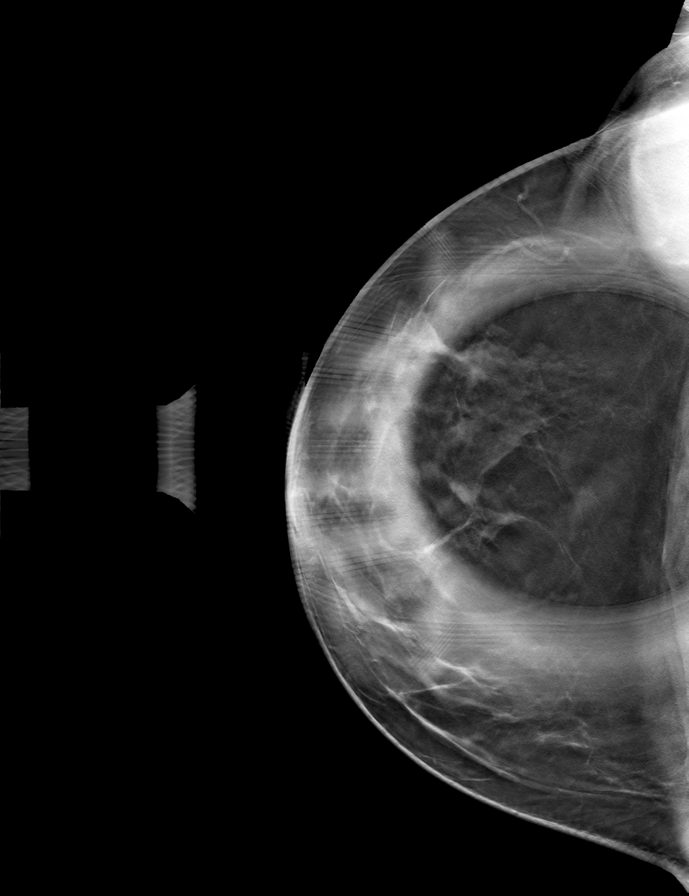

[L MLO tomo · tomo slice 33/64.0]
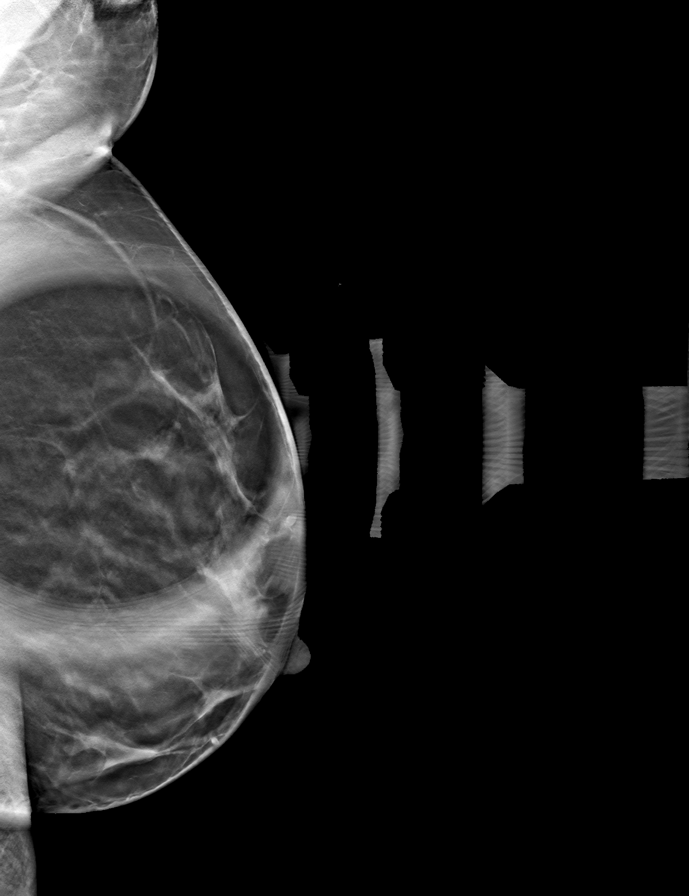

[L ML tomo · tomo slice 35/69.0]
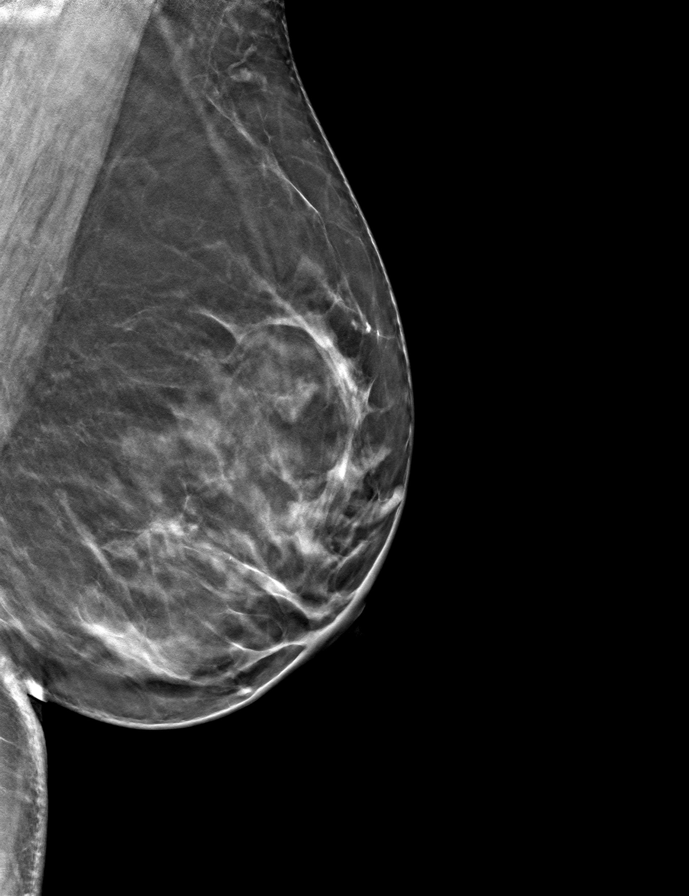

[R ML tomo · tomo slice 37/72.0]
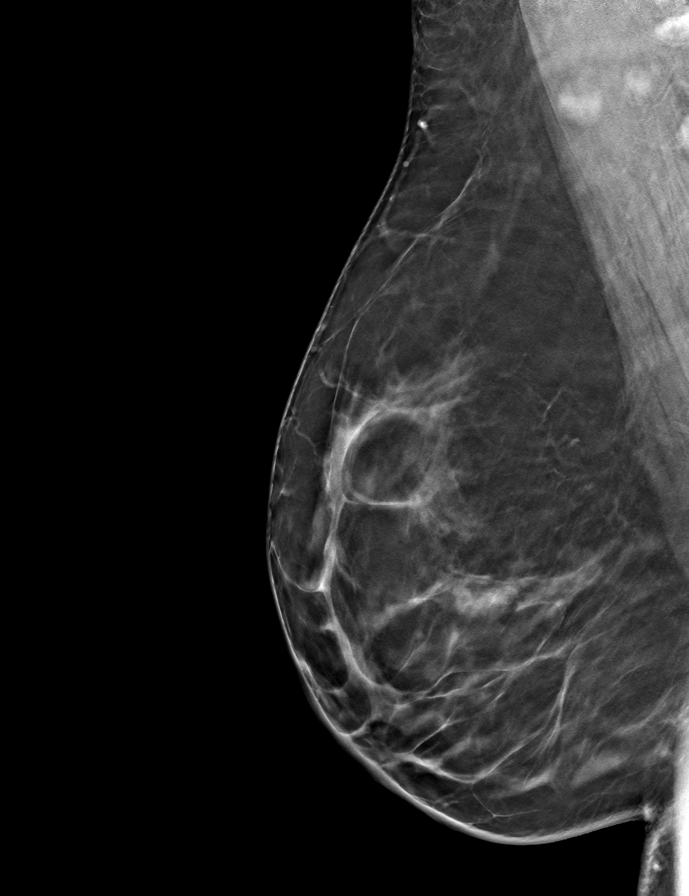

[8 of 24 positions shown; findings below may reference images not displayed]

ACR Breast Density Category b: There are scattered areas of
fibroglandular density.
FINDINGS: Spot compression of the superior left breast with tomography shows
no persistent mass. Normal fibroglandular tissue is seen. Overall
density of the fibroglandular tissue appears slightly increased
compared to priors, corresponding with history of hormone
replacement therapy. 90 degree lateral view of the left breast shows
no suspicious mass or distortion.

Spot compression views of the right breast confirm an oblong oval
mass in the posterior third of the 6 o'clock position of the right
breast. This is new compared to prior mammograms.

Mammographic images were processed with CAD.

On physical exam, I do not palpate a discrete mass in the 6 o'clock
region of the right breast. The patient questions a palpable area of
concern in the 9 o'clock position of the left breast when I was in
the ultrasound room with her. Her palpable area of concern
corresponds to the region of a previously followed (for 2 years,
ending in 2392) benign-appearing mass. She would like this area
evaluated today. I palpate smooth focal nodularity in the 9 o'clock
region of the left breast 3 cm from the nipple.

Targeted ultrasound is performed, showing a hypoechoic oblong
parallel circumscribed gently lobulated mass at 6 o'clock position 3
cm from the nipple measuring 1.3 x 0.3 x 0.7 cm, and corresponding
to the mass seen on the recent screening mammogram. There is a focal
area of internal vascular flow identified.

Ultrasound of the right axilla is negative for lymphadenopathy.

Ultrasound the 9 o'clock region the right breast, where the patient
has a palpable area of concern shows clusters of cysts consistent
with focal fibrocystic change that in total measures 1.2 cm. Largest
individual cluster of cysts is 0.7 cm, similar to the prior
ultrasound of 2392.
IMPRESSION: Hypoechoic mass 6 o'clock position right breast. Ultrasound-guided
core needle biopsy is recommended to exclude malignancy.

Focal fibrocystic changes in the region of patient concern 9 o'clock
position left breast. No suspicious findings in the left breast.

RECOMMENDATION:
Ultrasound-guided core needle biopsy of the right breast is
recommended.

I have discussed the findings and recommendations with the patient.
Results were also provided in writing at the conclusion of the
visit. If applicable, a reminder letter will be sent to the patient
regarding the next appointment.

BI-RADS CATEGORY  4: Suspicious.

ADDENDUM:
This addendum is created to correct a left/right error in the
ultrasound section of the report. The final

The first sentence in the final paragraph of the ultrasound section
should read as follows: Ultrasound of the 9 o'clock region of the
LEFT breast, where the patient has a palpable area of concern, shows
clusters of cysts consistent with focal fibrocystic change that in
total measures 1.2 cm.

*** End of Addendum ***

## 2019-09-17 IMAGING — US ULTRASOUND RIGHT BREAST LIMITED
1 series · 10 of 10 positions shown · non-contrast
Comparison: [DATE] [DATE], [DATE], [DATE] [DATE], [DATE], [DATE] [DATE], [DATE], [DATE] [DATE],
[DATE], [DATE] [DATE], [DATE]

Addendum:
CLINICAL DATA: 52-year-old patient recalled from recent screening
mammogram for evaluation of possible bilateral masses.After
evaluation of the right breast with ultrasound today, the patient
had questions about previous ultrasound follow-up to the left breast
in the 9 o'clock region.

The patient tells me that she takes hormone replacement therapy.
EXAM:
DIGITAL DIAGNOSTIC BILATERAL MAMMOGRAM WITH CAD AND TOMO
ULTRASOUND BILATERAL BREAST

[Series 1: ultrasound right breast limited · 0.05mm/px · 10 of 10 slices shown]
[im 1/10]
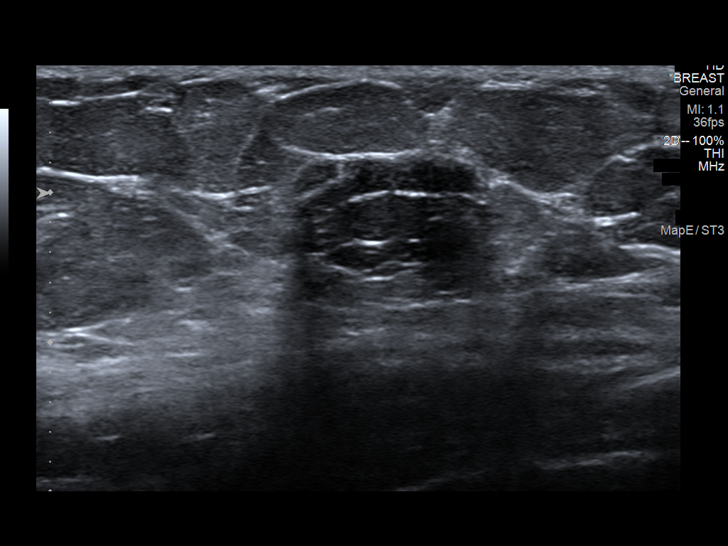
[im 2/10]
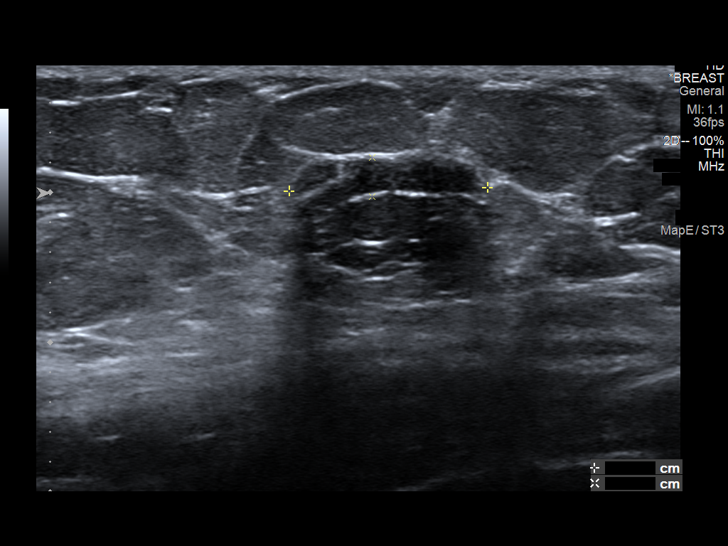
[im 3/10]
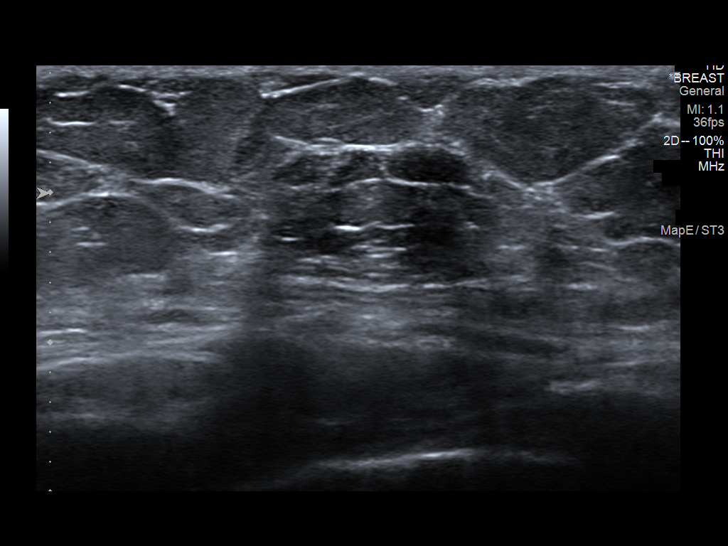
[im 4/10]
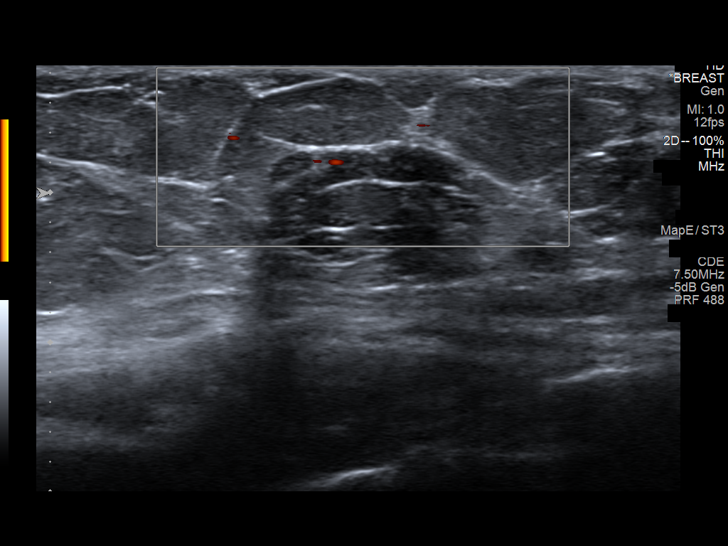
[im 5/10]
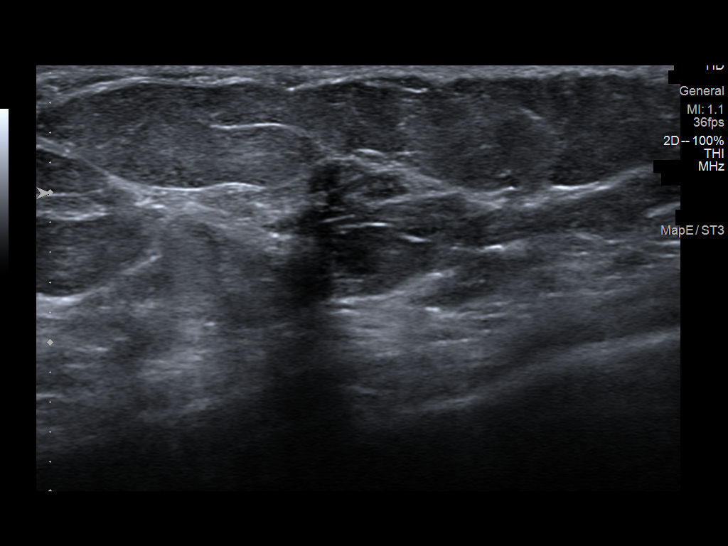
[im 6/10]
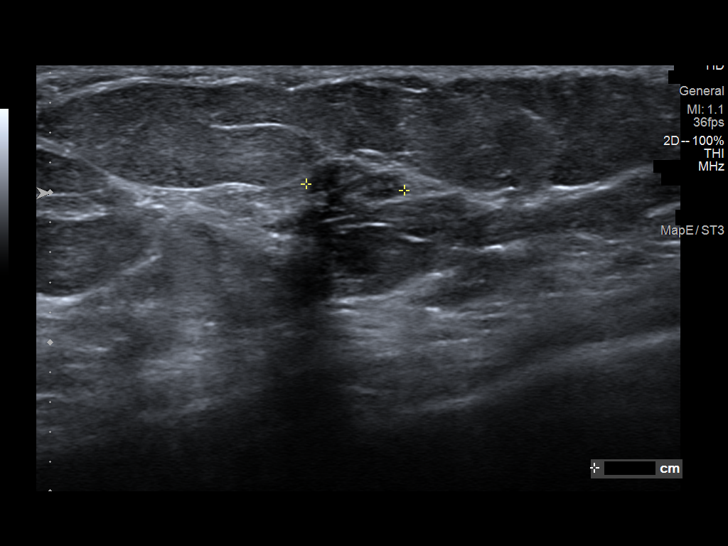
[im 7/10]
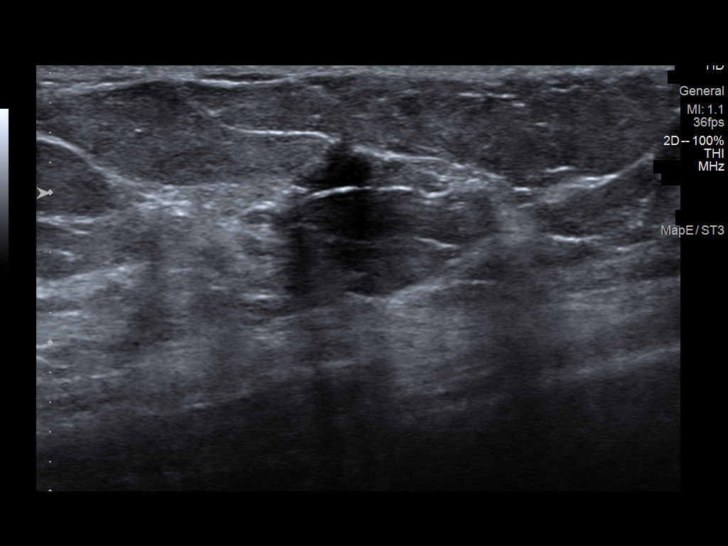
[im 8/10]
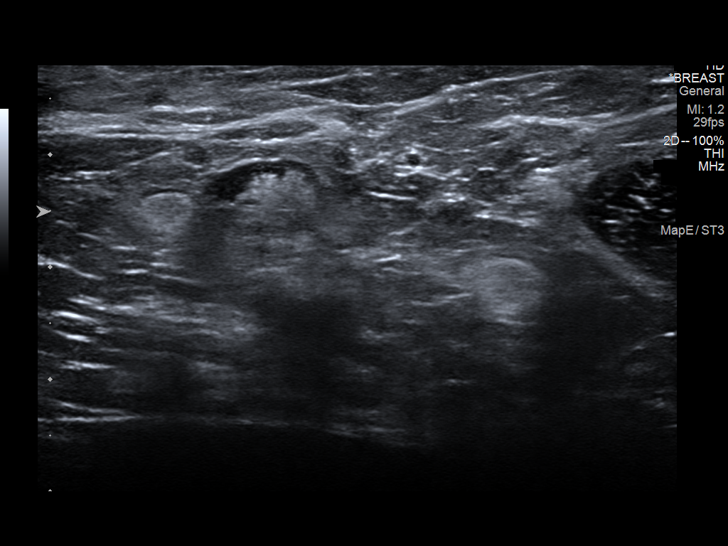
[im 9/10]
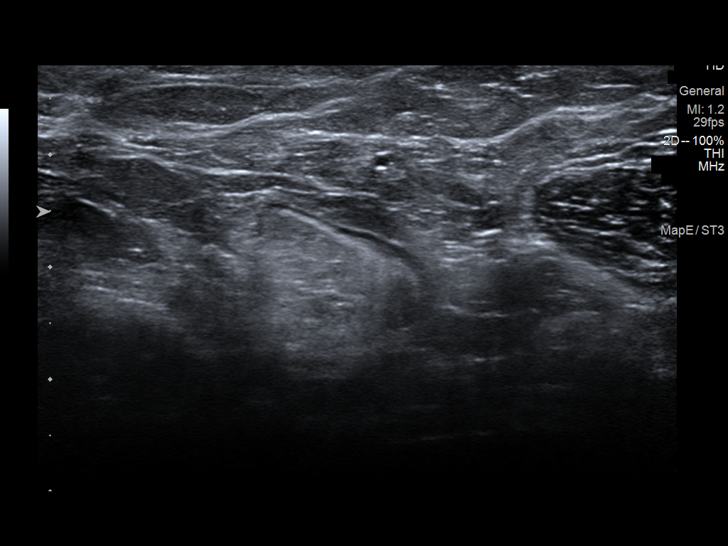
[im 10/10]
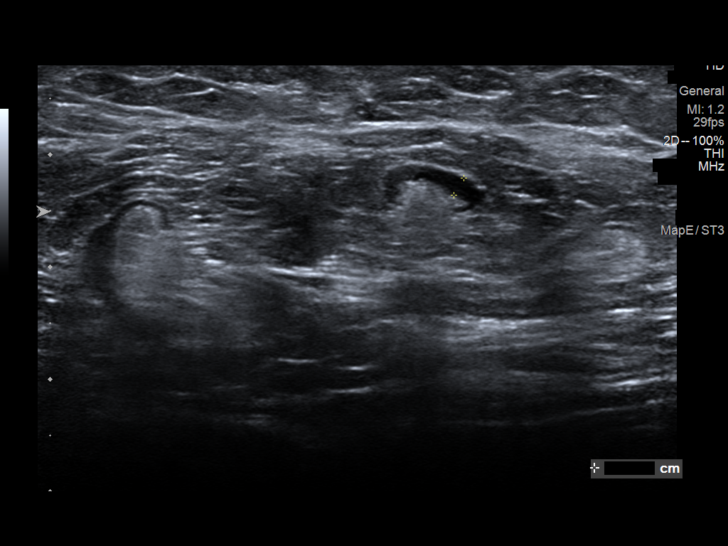

[10 of 10 positions shown; findings below may reference images not displayed]

ACR Breast Density Category b: There are scattered areas of
fibroglandular density.
FINDINGS: Spot compression of the superior left breast with tomography shows
no persistent mass. Normal fibroglandular tissue is seen. Overall
density of the fibroglandular tissue appears slightly increased
compared to priors, corresponding with history of hormone
replacement therapy. 90 degree lateral view of the left breast shows
no suspicious mass or distortion.

Spot compression views of the right breast confirm an oblong oval
mass in the posterior third of the 6 o'clock position of the right
breast. This is new compared to prior mammograms.

Mammographic images were processed with CAD.

On physical exam, I do not palpate a discrete mass in the 6 o'clock
region of the right breast. The patient questions a palpable area of
concern in the 9 o'clock position of the left breast when I was in
the ultrasound room with her. Her palpable area of concern
corresponds to the region of a previously followed (for 2 years,
ending in 2392) benign-appearing mass. She would like this area
evaluated today. I palpate smooth focal nodularity in the 9 o'clock
region of the left breast 3 cm from the nipple.

Targeted ultrasound is performed, showing a hypoechoic oblong
parallel circumscribed gently lobulated mass at 6 o'clock position 3
cm from the nipple measuring 1.3 x 0.3 x 0.7 cm, and corresponding
to the mass seen on the recent screening mammogram. There is a focal
area of internal vascular flow identified.

Ultrasound of the right axilla is negative for lymphadenopathy.

Ultrasound the 9 o'clock region the right breast, where the patient
has a palpable area of concern shows clusters of cysts consistent
with focal fibrocystic change that in total measures 1.2 cm. Largest
individual cluster of cysts is 0.7 cm, similar to the prior
ultrasound of 2392.
IMPRESSION: Hypoechoic mass 6 o'clock position right breast. Ultrasound-guided
core needle biopsy is recommended to exclude malignancy.

Focal fibrocystic changes in the region of patient concern 9 o'clock
position left breast. No suspicious findings in the left breast.

RECOMMENDATION:
Ultrasound-guided core needle biopsy of the right breast is
recommended.

I have discussed the findings and recommendations with the patient.
Results were also provided in writing at the conclusion of the
visit. If applicable, a reminder letter will be sent to the patient
regarding the next appointment.

BI-RADS CATEGORY  4: Suspicious.

ADDENDUM:
This addendum is created to correct a left/right error in the
ultrasound section of the report. The final

The first sentence in the final paragraph of the ultrasound section
should read as follows: Ultrasound of the 9 o'clock region of the
LEFT breast, where the patient has a palpable area of concern, shows
clusters of cysts consistent with focal fibrocystic change that in
total measures 1.2 cm.

*** End of Addendum ***

## 2019-09-20 IMAGING — MG MM CLIP PLACEMENT
2 series · 2 of 2 positions shown · non-contrast
Comparison: Previous exam(s).

CLINICAL DATA: Status post ultrasound-guided core needle biopsy of
the recently demonstrated 1.3 cm mass in the 6 o'clock position of
the right breast.

EXAM:
DIAGNOSTIC RIGHT MAMMOGRAM POST ULTRASOUND BIOPSY

[R CC]
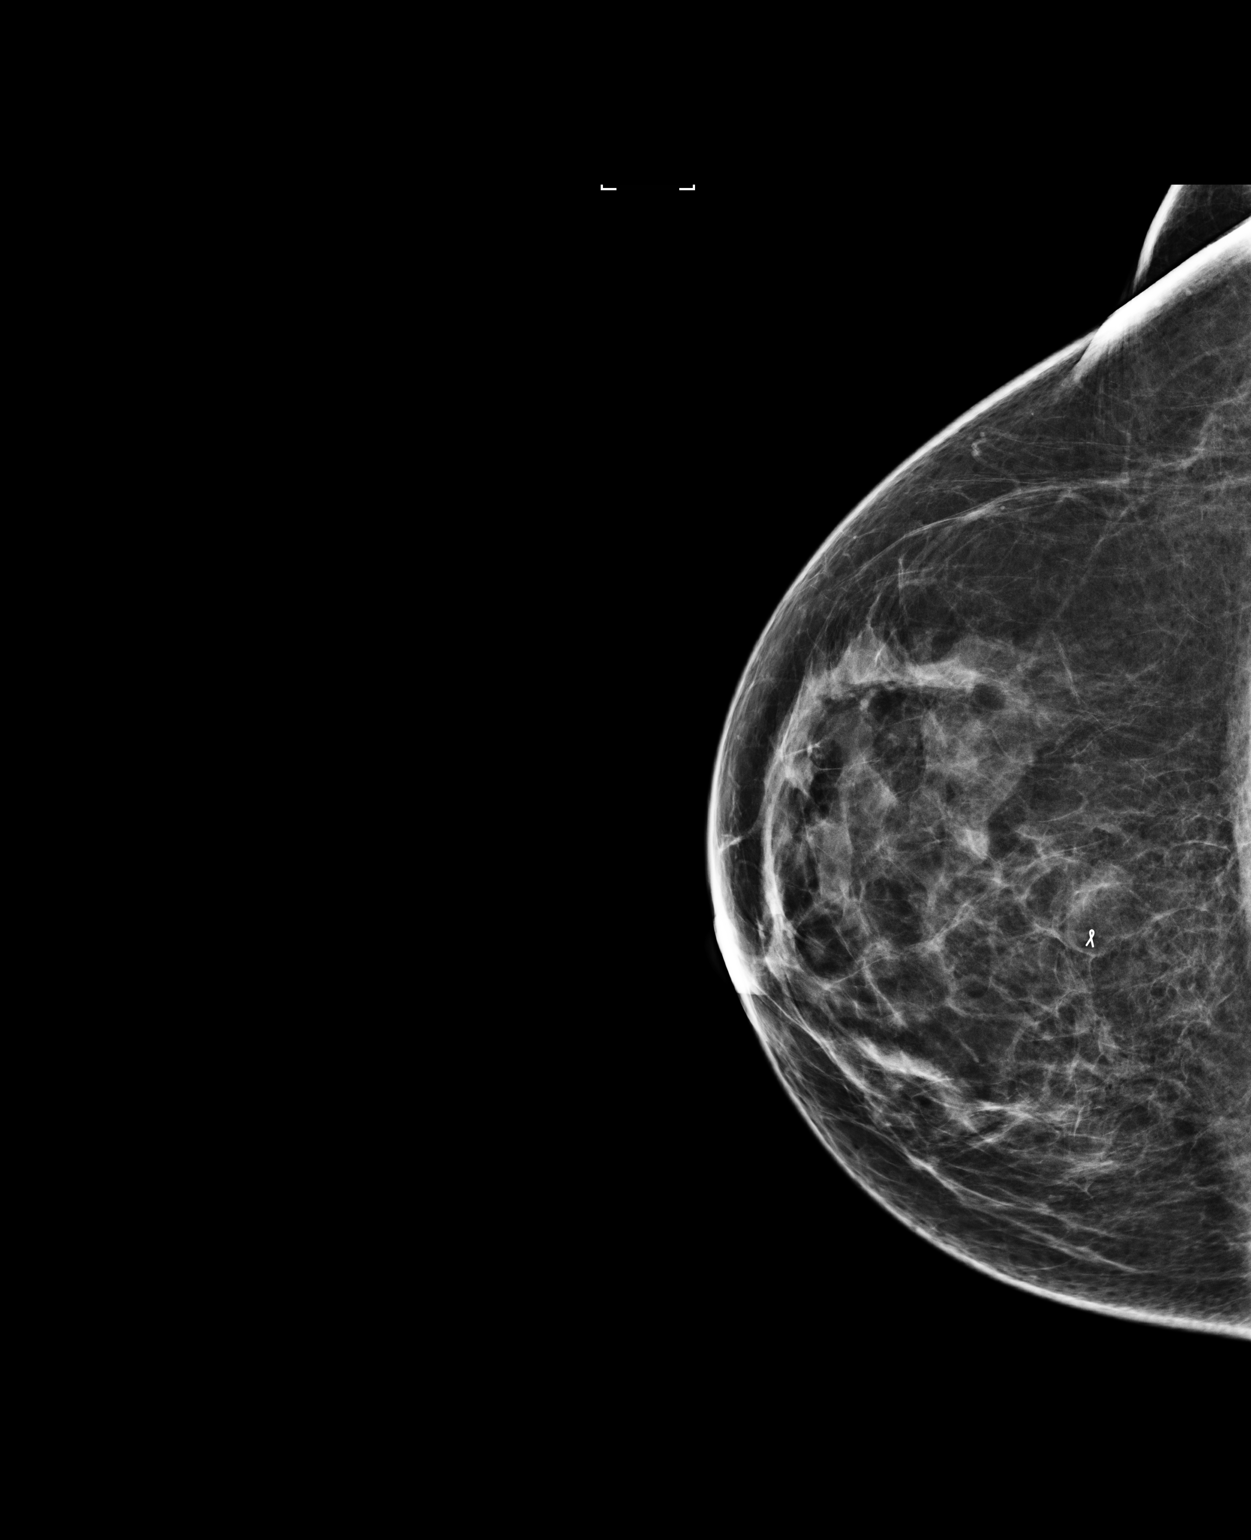

[R ML]
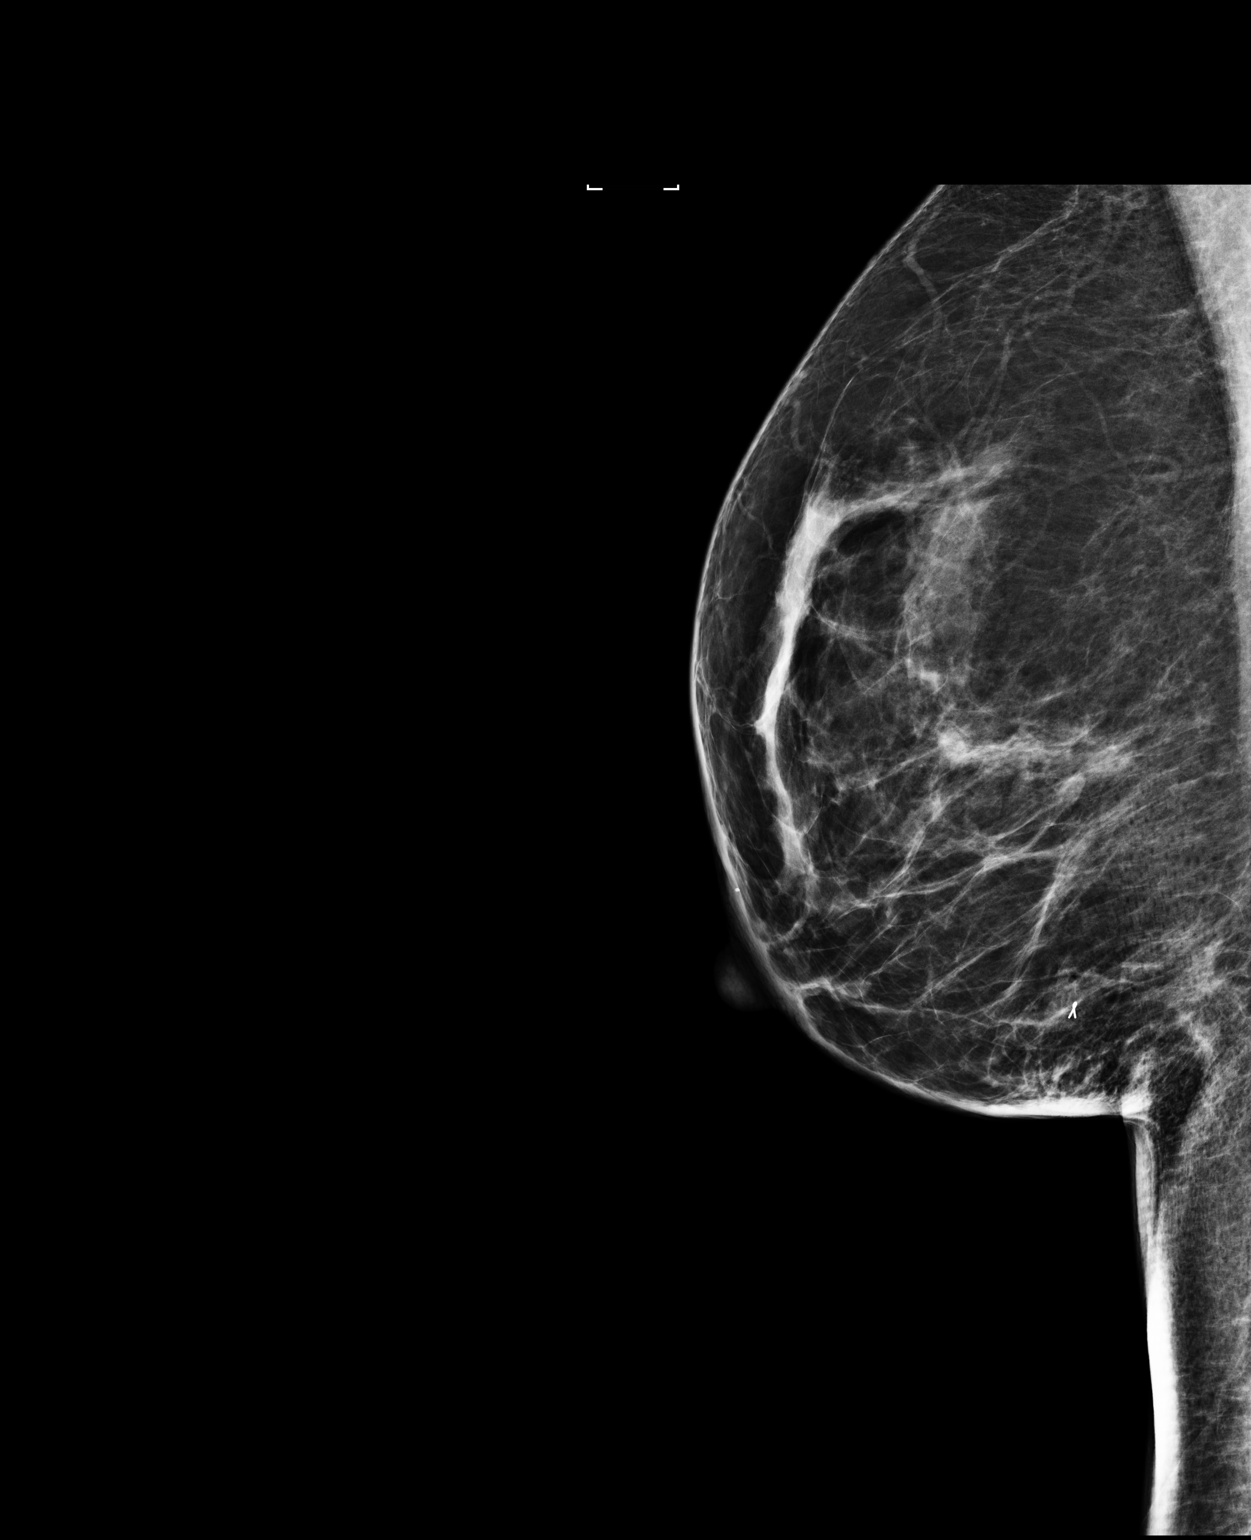

[2 of 2 positions shown; findings below may reference images not displayed]

FINDINGS: Mammographic images were obtained following ultrasound guided biopsy
of the recently demonstrated 1.3 cm mass in the 6 o'clock position
of the right breast. These demonstrate a ribbon shaped biopsy marker
clip at the location of the biopsied mass.
IMPRESSION: Appropriate clip deployment following ultrasound-guided right breast
core needle biopsy.

Final Assessment: Post Procedure Mammograms for Marker Placement

## 2022-05-03 ENCOUNTER — Other Ambulatory Visit: Payer: Self-pay

## 2022-05-03 DIAGNOSIS — R928 Other abnormal and inconclusive findings on diagnostic imaging of breast: Secondary | ICD-10-CM

## 2022-05-09 ENCOUNTER — Ambulatory Visit
Admission: RE | Admit: 2022-05-09 | Discharge: 2022-05-09 | Disposition: A | Discharge status: Home / Self Care | Payer: 59 | Source: Ambulatory Visit | Attending: Obstetrics and Gynecology | Admitting: Obstetrics and Gynecology

## 2022-05-09 ENCOUNTER — Other Ambulatory Visit: Payer: Self-pay | Admitting: Obstetrics and Gynecology

## 2022-05-09 DIAGNOSIS — R928 Other abnormal and inconclusive findings on diagnostic imaging of breast: Secondary | ICD-10-CM

## 2022-05-09 DIAGNOSIS — N6320 Unspecified lump in the left breast, unspecified quadrant: Secondary | ICD-10-CM

## 2022-05-10 ENCOUNTER — Other Ambulatory Visit: Payer: Self-pay | Admitting: Urology

## 2022-05-15 ENCOUNTER — Ambulatory Visit
Admission: RE | Admit: 2022-05-15 | Discharge: 2022-05-15 | Disposition: A | Discharge status: Home / Self Care | Payer: 59 | Source: Ambulatory Visit | Attending: Obstetrics and Gynecology | Admitting: Obstetrics and Gynecology

## 2022-05-15 DIAGNOSIS — R928 Other abnormal and inconclusive findings on diagnostic imaging of breast: Secondary | ICD-10-CM

## 2022-05-15 DIAGNOSIS — N6320 Unspecified lump in the left breast, unspecified quadrant: Secondary | ICD-10-CM

## 2022-05-15 HISTORY — PX: BREAST BIOPSY: SHX20

## 2022-05-16 ENCOUNTER — Other Ambulatory Visit: Payer: Self-pay | Admitting: Obstetrics and Gynecology

## 2022-05-16 DIAGNOSIS — N6320 Unspecified lump in the left breast, unspecified quadrant: Secondary | ICD-10-CM

## 2022-05-22 ENCOUNTER — Ambulatory Visit
Admission: RE | Admit: 2022-05-22 | Discharge: 2022-05-22 | Disposition: A | Discharge status: Home / Self Care | Payer: 59 | Source: Ambulatory Visit | Attending: Obstetrics and Gynecology | Admitting: Obstetrics and Gynecology

## 2022-05-22 ENCOUNTER — Encounter (HOSPITAL_BASED_OUTPATIENT_CLINIC_OR_DEPARTMENT_OTHER): Payer: Self-pay | Admitting: Urology

## 2022-05-22 DIAGNOSIS — N6320 Unspecified lump in the left breast, unspecified quadrant: Secondary | ICD-10-CM

## 2022-05-22 HISTORY — PX: BREAST BIOPSY: SHX20

## 2022-05-22 NOTE — Progress Notes (Signed)
Spoke w/ via phone for pre-op interview--- [facility]----  ISTAT and EKG             Lab results------ COVID test -----patient states asymptomatic no test needed Arrive at -------0700 NPO after MN NO Solid Food.   Med rec completed Medications to take morning of surgery -----NONE Diabetic medication ----- Patient instructed no nail polish to be worn day of surgery Patient instructed to bring photo id and insurance card day of surgery Patient aware to have Driver (ride ) / caregiver Ashley Bright   for 24 hours after surgery  Patient Special Instructions ----- Pre-Op special Istructions ----- Patient verbalized understanding of instructions that were given at this phone interview. Patient denies shortness of breath, chest pain, fever, cough at this phone interview.

## 2022-05-25 NOTE — H&P (Signed)
CC/HPI: cc: SUI   03/09/22: 57 year old woman referred for several year history of stress urinary continence. She has tried pelvic floor physical therapy which did not help her. She wears a panty liner has a change her pad a couple times a day. She has a history of twin vaginal delivery and 2 other vaginal deliveries. No surgery on her vagina or bladder. Patient denies urge incontinence. She leaks with coughing, sneezing and changing positions. She works 12-hour shifts and this is very bothersome to her.   Overactive bladder validated question screener: 1, 0, 0, 5, 4, 4, 0, 4=18/40   04/20/22: Here for cysto and pelvic exams   02/09/204: Patient here for preoperative appointment prior to undergoing Bulkamid procedure with Dr. Claudia Desanctis on 2/20. She continues to have bothersome SUI but otherwise stable LUTS consistent with prior baseline. No interval dysuria, gross hematuria or interval treatment for UTI. Patient tells me she has been diagnosed with an abnormal mass in the left breast. She has had a recent biopsy was told she did not have cancer but is scheduled for repeat biopsy on 2/13 (office procedure, local anesthesia). She has been told that she will likely need a mastectomy sometime in the near future. She denies a prior history of breast cancer. She denies any other changes in past medical or surgical history. No new prescription medications taken on a daily basis. Denies any recent fever/chills, nausea/vomiting, chest pain or shortness of breath.     ALLERGIES: Codeine - "don't like the way it makes me feel"    MEDICATIONS: Estrace 2 mg tablet  Metoprolol Succinate 100 mg tablet, extended release 24 hr  Amlodipine Besylate 5 mg tablet  Amphetamine Sulfate  Aripiprazole 5 mg tablet  Auvelity 45 mg-105 mg tablet,immed, extended release, biphasic  Dextroamphetamine Sulfate 30 mg tablet  Gabapentin 100 mg capsule  Ibuprofen 600 mg tablet  Potassium Chloride 20 meq tablet, ext release,  particles/crystals  Pravastatin Sodium 40 mg tablet  Valsartan-Hydrochlorothiazide 320 mg-12.5 mg tablet     GU PSH: Cystoscopy - 04/20/2022     NON-GU PSH: Carpal tunnel surgery Cesarean Delivery Cholecystectomy (laparoscopic)     GU PMH: Cystocele, midline - 04/20/2022 Stress Incontinence - 04/20/2022, - 03/09/2022 Nocturia - 03/09/2022    NON-GU PMH: Anxiety Arthritis Depression GERD Glaucoma Hypercholesterolemia Hypertension Sleep Apnea    FAMILY HISTORY: Diabetes - Mother Hypertension - Brother Kidney Failure - Mother Lung Cancer - Uncle, Grandmother, Aunt lung disease - Father   SOCIAL HISTORY: Marital Status: Divorced Current Smoking Status: Patient does not smoke anymore. Has not smoked since 03/09/2016. Smoked for 35 years. Smoked 2 packs per day.   Tobacco Use Assessment Completed: Used Tobacco in last 30 days? Does not drink anymore.  Drinks 4+ caffeinated drinks per day. Patient's occupation Scientist, forensic.    REVIEW OF SYSTEMS:    GU Review Female:   Patient reports get up at night to urinate and leakage of urine. Patient denies frequent urination, hard to postpone urination, burning /pain with urination, stream starts and stops, trouble starting your stream, have to strain to urinate, and being pregnant.  Gastrointestinal (Upper):   Patient denies nausea, vomiting, and indigestion/ heartburn.  Gastrointestinal (Lower):   Patient denies diarrhea and constipation.  Constitutional:   Patient denies weight loss, fatigue, night sweats, and fever.  Skin:   Patient denies skin rash/ lesion and itching.  Eyes:   Patient denies blurred vision and double vision.  Ears/ Nose/ Throat:   Patient denies sore throat  and sinus problems.  Hematologic/Lymphatic:   Patient denies swollen glands and easy bruising.  Cardiovascular:   Patient denies leg swelling and chest pains.  Respiratory:   Patient denies cough and shortness of breath.  Endocrine:   Patient denies  excessive thirst.  Musculoskeletal:   Patient denies back pain and joint pain.  Neurological:   Patient denies headaches and dizziness.  Psychologic:   Patient denies depression and anxiety.   VITAL SIGNS:      05/18/2022 11:46 AM  Weight 180 lb / 81.65 kg  Height 62 in / 157.48 cm  BP 134/75 mmHg  Pulse 85 /min  Temperature 97.1 F / 36.1 C  BMI 32.9 kg/m   MULTI-SYSTEM PHYSICAL EXAMINATION:    Constitutional: Well-nourished. No physical deformities. Normally developed. Good grooming.  Neck: Neck symmetrical, not swollen. Normal tracheal position.  Respiratory: No labored breathing, no use of accessory muscles.   Cardiovascular: Normal temperature, normal extremity pulses, no swelling, no varicosities.  Skin: No paleness, no jaundice, no cyanosis. No lesion, no ulcer, no rash.  Neurologic / Psychiatric: Oriented to time, oriented to place, oriented to person. No depression, no anxiety, no agitation.  Gastrointestinal: No mass, no tenderness, no rigidity, non obese abdomen.  Musculoskeletal: Normal gait and station of head and neck.     Complexity of Data:  Source Of History:  Patient, Medical Record Summary  Records Review:   Previous Doctor Records, Previous Hospital Records, Previous Patient Records  Urine Test Review:   Urinalysis  Urodynamics Review:   Review Bladder Scan   05/18/22  Urinalysis  Urine Appearance Clear   Urine Color Yellow   Urine Glucose Neg mg/dL  Urine Bilirubin Neg mg/dL  Urine Ketones Neg mg/dL  Urine Specific Gravity 1.020   Urine Blood Neg ery/uL  Urine pH 6.5   Urine Protein 2+ mg/dL  Urine Urobilinogen 0.2 mg/dL  Urine Nitrites Neg   Urine Leukocyte Esterase Neg leu/uL  Urine WBC/hpf NS (Not Seen)   Urine RBC/hpf NS (Not Seen)   Urine Epithelial Cells 0 - 5/hpf   Urine Bacteria NS (Not Seen)   Urine Mucous Not Present   Urine Yeast NS (Not Seen)   Urine Trichomonas Not Present   Urine Cystals NS (Not Seen)   Urine Casts NS (Not Seen)    Urine Sperm Not Present    PROCEDURES:          Urinalysis w/Scope Dipstick Dipstick Cont'd Micro  Color: Yellow Bilirubin: Neg mg/dL WBC/hpf: NS (Not Seen)  Appearance: Clear Ketones: Neg mg/dL RBC/hpf: NS (Not Seen)  Specific Gravity: 1.020 Blood: Neg ery/uL Bacteria: NS (Not Seen)  pH: 6.5 Protein: 2+ mg/dL Cystals: NS (Not Seen)  Glucose: Neg mg/dL Urobilinogen: 0.2 mg/dL Casts: NS (Not Seen)    Nitrites: Neg Trichomonas: Not Present    Leukocyte Esterase: Neg leu/uL Mucous: Not Present      Epithelial Cells: 0 - 5/hpf      Yeast: NS (Not Seen)      Sperm: Not Present    ASSESSMENT:      ICD-10 Details  1 GU:   Stress Incontinence - N39.3 Chronic, Stable  2   Nocturia - R35.1 Chronic, Stable   PLAN:           Orders Labs Urine Culture          Schedule Return Visit/Planned Activity: Keep Scheduled Appointment - Follow up MD, Schedule Surgery          Document Letter(s):  Created  for Patient: Clinical Summary         Notes:   All questions answered to the best my ability regarding the upcoming procedure and expected postoperative course with understanding expressed by the patient. Urine culture sent today to serve as a precautionary baseline. She will proceed with previously scheduled Bulkamid procedure on 2/20 with Dr. Claudia Desanctis

## 2022-05-29 ENCOUNTER — Ambulatory Visit (HOSPITAL_BASED_OUTPATIENT_CLINIC_OR_DEPARTMENT_OTHER): Payer: 59 | Admitting: Anesthesiology

## 2022-05-29 ENCOUNTER — Ambulatory Visit (HOSPITAL_BASED_OUTPATIENT_CLINIC_OR_DEPARTMENT_OTHER)
Admission: RE | Admit: 2022-05-29 | Discharge: 2022-05-29 | Disposition: A | Discharge status: Home / Self Care | Payer: 59 | Source: Ambulatory Visit | Attending: Urology | Admitting: Urology

## 2022-05-29 ENCOUNTER — Encounter (HOSPITAL_BASED_OUTPATIENT_CLINIC_OR_DEPARTMENT_OTHER)
Admission: RE | Disposition: A | Discharge status: Home / Self Care | Payer: Self-pay | Source: Ambulatory Visit | Attending: Urology

## 2022-05-29 ENCOUNTER — Encounter (HOSPITAL_BASED_OUTPATIENT_CLINIC_OR_DEPARTMENT_OTHER): Payer: Self-pay | Admitting: Urology

## 2022-05-29 ENCOUNTER — Other Ambulatory Visit: Payer: Self-pay

## 2022-05-29 DIAGNOSIS — R351 Nocturia: Secondary | ICD-10-CM | POA: Insufficient documentation

## 2022-05-29 DIAGNOSIS — F17210 Nicotine dependence, cigarettes, uncomplicated: Secondary | ICD-10-CM

## 2022-05-29 DIAGNOSIS — I10 Essential (primary) hypertension: Secondary | ICD-10-CM | POA: Insufficient documentation

## 2022-05-29 DIAGNOSIS — Z87891 Personal history of nicotine dependence: Secondary | ICD-10-CM | POA: Diagnosis not present

## 2022-05-29 DIAGNOSIS — F418 Other specified anxiety disorders: Secondary | ICD-10-CM

## 2022-05-29 DIAGNOSIS — N393 Stress incontinence (female) (male): Secondary | ICD-10-CM | POA: Diagnosis not present

## 2022-05-29 DIAGNOSIS — K219 Gastro-esophageal reflux disease without esophagitis: Secondary | ICD-10-CM | POA: Diagnosis not present

## 2022-05-29 DIAGNOSIS — G4730 Sleep apnea, unspecified: Secondary | ICD-10-CM | POA: Diagnosis not present

## 2022-05-29 DIAGNOSIS — G709 Myoneural disorder, unspecified: Secondary | ICD-10-CM | POA: Diagnosis not present

## 2022-05-29 HISTORY — DX: Sleep apnea, unspecified: G47.30

## 2022-05-29 HISTORY — PX: CYSTOSCOPY WITH INJECTION: SHX1424

## 2022-05-29 LAB — POCT I-STAT, CHEM 8
BUN: 15 mg/dL (ref 6–20)
Calcium, Ion: 1.17 mmol/L (ref 1.15–1.40)
Chloride: 100 mmol/L (ref 98–111)
Creatinine, Ser: 1 mg/dL (ref 0.44–1.00)
Glucose, Bld: 137 mg/dL — ABNORMAL HIGH (ref 70–99)
HCT: 38 % (ref 36.0–46.0)
Hemoglobin: 12.9 g/dL (ref 12.0–15.0)
Potassium: 3.4 mmol/L — ABNORMAL LOW (ref 3.5–5.1)
Sodium: 140 mmol/L (ref 135–145)
TCO2: 26 mmol/L (ref 22–32)

## 2022-05-29 SURGERY — CYSTOSCOPY, WITH INJECTION OF BLADDER NECK OR BLADDER WALL
Anesthesia: Monitor Anesthesia Care | Site: Vagina

## 2022-05-29 MED ORDER — MIDAZOLAM HCL 2 MG/2ML IJ SOLN
INTRAMUSCULAR | Status: DC | PRN
  Administered 2022-05-29: 1 mg via INTRAVENOUS

## 2022-05-29 MED ORDER — CEFAZOLIN SODIUM-DEXTROSE 2-4 GM/100ML-% IV SOLN
2.0000 g | INTRAVENOUS | Status: AC
  Administered 2022-05-29: 2 g via INTRAVENOUS

## 2022-05-29 MED ORDER — LACTATED RINGERS IV SOLN: INTRAVENOUS | Status: DC

## 2022-05-29 MED ORDER — WATER FOR IRRIGATION, STERILE IR SOLN
Status: DC | PRN
  Administered 2022-05-29: 3000 mL

## 2022-05-29 MED ORDER — FENTANYL CITRATE (PF) 100 MCG/2ML IJ SOLN
INTRAMUSCULAR | Status: AC
  Filled 2022-05-29: qty 2

## 2022-05-29 MED ORDER — FENTANYL CITRATE (PF) 100 MCG/2ML IJ SOLN: 25.0000 ug | INTRAMUSCULAR | Status: DC | PRN

## 2022-05-29 MED ORDER — FENTANYL CITRATE (PF) 100 MCG/2ML IJ SOLN
INTRAMUSCULAR | Status: DC | PRN
  Administered 2022-05-29: 50 ug via INTRAVENOUS

## 2022-05-29 MED ORDER — AMISULPRIDE (ANTIEMETIC) 5 MG/2ML IV SOLN: 10.0000 mg | Freq: Once | INTRAVENOUS | Status: DC | PRN

## 2022-05-29 MED ORDER — CEFAZOLIN SODIUM-DEXTROSE 2-4 GM/100ML-% IV SOLN
INTRAVENOUS | Status: AC
  Filled 2022-05-29: qty 100

## 2022-05-29 MED ORDER — OXYCODONE HCL 5 MG/5ML PO SOLN: 5.0000 mg | Freq: Once | ORAL | Status: DC | PRN

## 2022-05-29 MED ORDER — PROMETHAZINE HCL 25 MG/ML IJ SOLN: 6.2500 mg | INTRAMUSCULAR | Status: DC | PRN

## 2022-05-29 MED ORDER — PROPOFOL 500 MG/50ML IV EMUL
INTRAVENOUS | Status: DC | PRN
  Administered 2022-05-29: 200 ug/kg/min via INTRAVENOUS

## 2022-05-29 MED ORDER — KETOROLAC TROMETHAMINE 30 MG/ML IJ SOLN: 30.0000 mg | Freq: Once | INTRAMUSCULAR | Status: DC

## 2022-05-29 MED ORDER — PROPOFOL 500 MG/50ML IV EMUL
INTRAVENOUS | Status: AC
  Filled 2022-05-29: qty 50

## 2022-05-29 MED ORDER — OXYCODONE HCL 5 MG PO TABS: 5.0000 mg | ORAL_TABLET | Freq: Once | ORAL | Status: DC | PRN

## 2022-05-29 MED ORDER — ACETAMINOPHEN 10 MG/ML IV SOLN: 1000.0000 mg | Freq: Once | INTRAVENOUS | Status: DC | PRN

## 2022-05-29 MED ORDER — MIDAZOLAM HCL 2 MG/2ML IJ SOLN
INTRAMUSCULAR | Status: AC
  Filled 2022-05-29: qty 2

## 2022-05-29 SURGICAL SUPPLY — 21 items
BAG DRAIN URO-CYSTO SKYTR STRL (DRAIN) ×1 IMPLANT
BAG DRN UROCATH (DRAIN) ×1
CLOTH BEACON ORANGE TIMEOUT ST (SAFETY) ×1 IMPLANT
ELECT REM PT RETURN 9FT ADLT (ELECTROSURGICAL)
ELECTRODE REM PT RTRN 9FT ADLT (ELECTROSURGICAL) ×1 IMPLANT
GLOVE BIO SURGEON STRL SZ 6.5 (GLOVE) ×1 IMPLANT
GLOVE BIOGEL PI IND STRL 6 (GLOVE) IMPLANT
GOWN STRL REUS W/TWL LRG LVL3 (GOWN DISPOSABLE) ×1 IMPLANT
KIT TURNOVER CYSTO (KITS) ×1 IMPLANT
MANIFOLD NEPTUNE II (INSTRUMENTS) ×1 IMPLANT
NDL ASPIRATION 22 (NEEDLE) ×1 IMPLANT
NDL SAFETY ECLIP 18X1.5 (MISCELLANEOUS) ×1 IMPLANT
NEEDLE ASPIRATION 22 (NEEDLE) ×1 IMPLANT
PACK CYSTO (CUSTOM PROCEDURE TRAY) ×1 IMPLANT
SLEEVE SCD COMPRESS KNEE MED (STOCKING) ×1 IMPLANT
SYR 20ML LL LF (SYRINGE) ×1 IMPLANT
SYR CONTROL 10ML LL (SYRINGE) ×1 IMPLANT
SYSTEM URETHRAL BULK BULKAMID (Female Continence) IMPLANT
TUBE CONNECTING 12X1/4 (SUCTIONS) ×1 IMPLANT
TUBING UROLOGY SET (TUBING) ×1 IMPLANT
WATER STERILE IRR 3000ML UROMA (IV SOLUTION) ×1 IMPLANT

## 2022-05-29 NOTE — Interval H&P Note (Signed)
History and Physical Interval Note:  05/29/2022 7:51 AM  Ashley Bright  has presented today for surgery, with the diagnosis of STRESS URINARY INCONTINENCE.  The various methods of treatment have been discussed with the patient and family. After consideration of risks, benefits and other options for treatment, the patient has consented to  Procedure(s): CYSTOSCOPY WITH  BULKAMID INJECTION (N/A) as a surgical intervention.  The patient's history has been reviewed, patient examined, no change in status, stable for surgery.  I have reviewed the patient's chart and labs.  Questions were answered to the patient's satisfaction.     [name] D Margarito Dehaas

## 2022-05-29 NOTE — Anesthesia Preprocedure Evaluation (Addendum)
Anesthesia Evaluation  Patient identified by MRN, date of birth, ID band Patient awake    Reviewed: Allergy & Precautions, NPO status , Patient's Chart, lab work & pertinent test results  Airway Mallampati: III  TM Distance: >3 FB Neck ROM: Full    Dental  (+) Missing   Pulmonary sleep apnea , former smoker   Pulmonary exam normal        Cardiovascular hypertension, Pt. on medications and Pt. on home beta blockers Normal cardiovascular exam     Neuro/Psych  PSYCHIATRIC DISORDERS Anxiety Depression     Neuromuscular disease    GI/Hepatic Neg liver ROS,GERD  Medicated and Controlled,,  Endo/Other  negative endocrine ROS    Renal/GU negative Renal ROS     Musculoskeletal negative musculoskeletal ROS (+)    Abdominal  (+) + obese  Peds  (+) ATTENTION DEFICIT DISORDER WITHOUT HYPERACTIVITY Hematology negative hematology ROS (+)   Anesthesia Other Findings STRESS URINARY INCONTINENCE  Reproductive/Obstetrics                             Anesthesia Physical Anesthesia Plan  ASA: 3  Anesthesia Plan: MAC   Post-op Pain Management:    Induction: Intravenous  PONV Risk Score and Plan: 1 and Ondansetron, Dexamethasone, Propofol infusion, Midazolam and Treatment may vary due to age or medical condition  Airway Management Planned: Simple Face Mask  Additional Equipment:   Intra-op Plan:   Post-operative Plan:   Informed Consent: I have reviewed the patients History and Physical, chart, labs and discussed the procedure including the risks, benefits and alternatives for the proposed anesthesia with the patient or authorized representative who has indicated his/her understanding and acceptance.     Dental advisory given  Plan Discussed with: CRNA  Anesthesia Plan Comments:        Anesthesia Quick Evaluation

## 2022-05-29 NOTE — Anesthesia Postprocedure Evaluation (Signed)
Anesthesia Post Note  Patient: Conservation officer, historic buildings  Procedure(s) Performed: CYSTOSCOPY WITH  BULKAMID INJECTION (Vagina )     Patient location during evaluation: PACU Anesthesia Type: MAC Level of consciousness: awake Pain management: pain level controlled Vital Signs Assessment: post-procedure vital signs reviewed and stable Respiratory status: spontaneous breathing, nonlabored ventilation and respiratory function stable Cardiovascular status: blood pressure returned to baseline and stable Postop Assessment: no apparent nausea or vomiting Anesthetic complications: no   No notable events documented.  Last Vitals:  Vitals:   05/29/22 0930 05/29/22 0950  BP: 130/83 127/83  Pulse: 80 80  Resp: 14 15  Temp:  36.7 C  SpO2: 95% 99%    Last Pain:  Vitals:   05/29/22 0950  TempSrc:   PainSc: 0-No pain                 Ceil Roderick P Jhana Giarratano

## 2022-05-29 NOTE — Transfer of Care (Signed)
Immediate Anesthesia Transfer of Care Note  Patient: Conservation officer, historic buildings  Procedure(s) Performed: Procedure(s) (LRB): CYSTOSCOPY WITH  BULKAMID INJECTION (N/A)  Patient Location: PACU  Anesthesia Type: MAC  Level of Consciousness: awake, alert , oriented and patient cooperative  Airway & Oxygen Therapy: Patient Spontanous Breathing and Patient connected to face mask oxygen  Post-op Assessment: Report given to PACU RN and Post -op Vital signs reviewed and stable  Post vital signs: Reviewed and stable  Complications: No apparent anesthesia complications  Last Vitals:  Vitals Value Taken Time  BP 128/78 05/29/22 0902  Temp 36.2 C 05/29/22 0902  Pulse 78 05/29/22 0906  Resp 11 05/29/22 0906  SpO2 97 % 05/29/22 0906  Vitals shown include unvalidated device data.  Last Pain:  Vitals:   05/29/22 0731  TempSrc: Oral  PainSc: 0-No pain      Patients Stated Pain Goal: 4 ([id number])  Complications: No notable events documented.

## 2022-05-29 NOTE — Op Note (Signed)
Operative Note   Preoperative diagnosis:  1.  Stress urinary incontinence   Postoperative diagnosis: 1.  Stress urinary incontinence   Procedure(s): 1.  Cystoscopy with injection of bulkamid   Surgeon: Jacalyn Lefevre, MD   Assistants:  None   Anesthesia:  General   Complications:  None   EBL:  minimal   Specimens: 1. none   Drains/Catheters: 1.  none   Intraoperative findings:   Normal urethra   Indication: 57 year old woman with symptomatic stress urinary incontinence here for Bulkamid.   Description of procedure:   After risks and benefits of the procedure discussed with the patient, informed consent was obtained.  The patient was taken to the operating placed in the supine position.  Anesthesia was induced and antibiotics were administered.  The patient was then repositioned in the dorsolithotomy position.  She was prepped and draped in usual sterile fashion a timeout performed with the attending present.  The cystoscope was assembled with the Bulkamid system.  It was then placed in the urethral meatus and advanced into the bladder under direct visualization.  Prior cystoscopy had been done which noted normal anatomic landmarks.  These were again verified during cystoscopy today.  The cystoscope was brought back to the bladder neck and the needle was advanced through the needle guide at the 1 o'clock position.  Once it was visualized and advanced it was rotated to the 5 o'clock position.  Bulkamid was then injected until blood was seen.  This was then repeated at the 1 o'clock position in the 7 o'clock position until coaptation was noted.   This concluded the case.  The patient's bladder was left with approximately 200 cc of sterile saline.  The patient emerged from anesthesia and was transferred the PACU in stable condition.   Plan:  Plan for patient to void in PACU prior to discharge.

## 2022-05-29 NOTE — Discharge Instructions (Addendum)
Cystoscopy with Bulkamid patient instructions  Following a cystoscopy, a catheter (a flexible rubber tube) is sometimes left in place to empty the bladder. This may cause some discomfort or a feeling that you need to urinate. Your doctor determines the period of time that the catheter will be left in place. You may have bloody urine for two to three days (Call your doctor if the amount of bleeding increases or does not subside).  You may pass blood clots in your urine, especially if you had a biopsy. It is not unusual to pass small blood clots and have some bloody urine a couple of weeks after your cystoscopy. Again, call your doctor if the bleeding does not subside. You may have: Dysuria (painful urination) Frequency (urinating often) Urgency (strong desire to urinate)  These symptoms are common especially if medicine is instilled into the bladder or a ureteral stent is placed. Avoiding alcohol and caffeine, such as coffee, tea, and chocolate, may help relieve these symptoms. Drink plenty of water, unless otherwise instructed. Your doctor may also prescribe an antibiotic or other medicine to reduce these symptoms.  Cystoscopy results are available soon after the procedure; biopsy results usually take two to four days. Your doctor will discuss the results of your exam with you. Before you go home, you will be given specific instructions for follow-up care. Special Instructions:   If you are going home with a catheter in place do not take a tub bath until removed by your doctor.   You may resume your normal activities.   Do not drive or operate machinery if you are taking narcotic pain medicine.   Be sure to keep all follow-up appointments with your doctor.   Call Your Doctor If: The catheter is not draining You have severe pain You are unable to urinate You have a fever over 101 You have severe bleeding   Post Anesthesia Home Care Instructions  Activity: Get plenty of rest for the  remainder of the day. A responsible individual must stay with you for 24 hours following the procedure.  For the next 24 hours, DO NOT: -Drive a car -Paediatric nurse -Drink alcoholic beverages -Take any medication unless instructed by your physician -Make any legal decisions or sign important papers.  Meals: Start with liquid foods such as gelatin or soup. Progress to regular foods as tolerated. Avoid greasy, spicy, heavy foods. If nausea and/or vomiting occur, drink only clear liquids until the nausea and/or vomiting subsides. Call your physician if vomiting continues.  Special Instructions/Symptoms: Your throat may feel dry or sore from the anesthesia or the breathing tube placed in your throat during surgery. If this causes discomfort, gargle with warm salt water. The discomfort should disappear within 24 hours.

## 2022-05-29 NOTE — Anesthesia Procedure Notes (Signed)
Procedure Name: MAC Date/Time: 05/29/2022 8:37 AM  Performed by: Suan Halter, CRNAPre-anesthesia Checklist: Patient identified, Emergency Drugs available, Suction available, Patient being monitored and Timeout performed Patient Re-evaluated:Patient Re-evaluated prior to induction Oxygen Delivery Method: Simple face mask

## 2022-05-30 ENCOUNTER — Encounter (HOSPITAL_BASED_OUTPATIENT_CLINIC_OR_DEPARTMENT_OTHER): Payer: Self-pay | Admitting: Urology

## 2022-06-01 ENCOUNTER — Ambulatory Visit: Payer: Self-pay | Admitting: Surgery

## 2022-06-01 DIAGNOSIS — D242 Benign neoplasm of left breast: Secondary | ICD-10-CM

## 2022-06-01 NOTE — H&P (Signed)
Subjective    Chief Complaint: Breast Mass       History of Present Illness: Ashley Bright is a 57 y.o. female who is seen today as an office consultation at the request of Dr. Ouida Sills for evaluation of Breast Mass .   This is a 57 year old female who recently underwent routine screening mammogram.  This showed possible masses in the left medial breast.  She underwent further workup with diagnostic mammogram and ultrasound.  This showed multiple masses in the medial left breast from 6:00 to 12:00.  At 6:00, she had a 1.5 x 1.2 x 0.6 cm solid mass located 1 cm from the nipple.  She underwent subsequent biopsy of this area that revealed intraductal papilloma. At 8:00, 2 cm from the nipple, she had a 1.2 x 1.1 x 0.5 cm mass that was biopsied and revealed fibrocystic changes.  Adjacent to this was a 8 mm mass that was not biopsied.  At 9:00 5 cm from the nipple, there is a 1.2 x 0.6 x 0.5 cm mass that was not biopsied.  At 12:00, she had a 1.0 x 0.6 x 0.3 cm mass that was biopsied and revealed fibrocystic changes.  She was then referred to [place] to discuss excision of the intraductal papilloma.  The patient denies any symptoms.  No family history of breast cancer.     Review of Systems: A complete review of systems was obtained from the patient.  I have reviewed this information and discussed as appropriate with the patient.  See HPI as well for other ROS.   Review of Systems  Constitutional: Negative.   HENT: Negative.    Eyes: Negative.   Respiratory: Negative.    Cardiovascular: Negative.   Gastrointestinal: Negative.   Genitourinary: Negative.   Musculoskeletal: Negative.   Skin: Negative.   Neurological: Negative.   Endo/Heme/Allergies: Negative.   Psychiatric/Behavioral:  Positive for depression. The patient is nervous/anxious.         Medical History: Past Medical History      Past Medical History:  Diagnosis Date   Arthritis     GERD (gastroesophageal reflux disease)      Glaucoma (increased eye pressure)     Hypertension     Sleep apnea             Patient Active Problem List  Diagnosis   Abnormal TSH   Attention deficit disorder   Carpal tunnel syndrome, bilateral   Cervical radiculitis   Class 1 obesity due to excess calories with serious comorbidity and body mass index (BMI) of 33.0 to 33.9 in adult   Essential hypertension   Generalized anxiety disorder   History of migraine   Hyperglycemia   MDD (major depressive disorder)      Past Surgical History       Past Surgical History:  Procedure Laterality Date   CHOLECYSTECTOMY       ENDOSCOPIC CARPAL TUNNEL RELEASE            Allergies  No Known Allergies           Current Outpatient Medications on File Prior to Visit  Medication Sig Dispense Refill   amLODIPine (NORVASC) 5 MG tablet Take 1 tablet by mouth once daily       cyclobenzaprine (FLEXERIL) 5 MG tablet         estradioL (ESTRACE) 2 MG tablet Take 1 tablet by mouth once daily       metoprolol succinate (TOPROL-XL) 100 MG XL tablet Take  1 tablet by mouth once daily       omeprazole (PRILOSEC) 40 MG DR capsule TAKE ONE CAPSULE (40 MG TOTAL) BY MOUTH 2 (TWO) TIMES DAILY.       potassium chloride (KLOR-CON) 20 MEQ ER tablet         pravastatin (PRAVACHOL) 40 MG tablet Take 1 tablet by mouth at bedtime       valsartan-hydroCHLOROthiazide (DIOVAN-HCT) 320-12.5 mg tablet Take 1 tablet by mouth once daily       ARIPiprazole (ABILIFY) 2 MG tablet Take by mouth       AUVELITY 45-105 mg TbIE         dextroamphetamine-amphetamine (ADDERALL XR) 30 MG XR capsule TAKE 2 CAPSULES BY MOUTH IN THE MORNING        No current facility-administered medications on file prior to visit.      Family History       Family History  Problem Relation Age of Onset   High blood pressure (Hypertension) Mother     Hyperlipidemia (Elevated cholesterol) Mother     Coronary Artery Disease (Blocked arteries around heart) Mother     Diabetes Mother      High blood pressure (Hypertension) Brother     Coronary Artery Disease (Blocked arteries around heart) Brother          Social History        Tobacco Use  Smoking Status Former   Types: Cigarettes  Smokeless Tobacco Never      Social History  Social History         Socioeconomic History   Marital status: Divorced  Tobacco Use   Smoking status: Former      Types: Cigarettes   Smokeless tobacco: Never  Vaping Use   Vaping Use: Never used  Substance and Sexual Activity   Alcohol use: Never   Drug use: Never        Objective:         Vitals:    06/01/22 1014  BP: 124/80  Pulse: 85  Temp: 36.7 C (98 F)  SpO2: 98%  Weight: 84.8 kg (187 lb)  Height: 157.5 cm ('5\' 2"'$ )  PainSc: 0-No pain    Body mass index is 34.2 kg/m.   Physical Exam    Constitutional:  WDWN in NAD, conversant, no obvious deformities; lying in bed comfortably Eyes:  Pupils equal, round; sclera anicteric; moist conjunctiva; no lid lag HENT:  Oral mucosa moist; good dentition  Neck:  No masses palpated, trachea midline; no thyromegaly Lungs:  CTA bilaterally; normal respiratory effort Breasts:  symmetric, no nipple changes; no palpable masses or lymphadenopathy on either side CV:  Regular rate and rhythm; no murmurs; extremities well-perfused with no edema Abd:  +bowel sounds, soft, non-tender, no palpable organomegaly; no palpable hernias Musc:  Normal gait; no apparent clubbing or cyanosis in extremities Lymphatic:  No palpable cervical or axillary lymphadenopathy Skin:  Warm, dry; no sign of jaundice Psychiatric - alert and oriented x 4; calm mood and affect     Labs, Imaging and Diagnostic Testing: CLINICAL DATA:  Possible masses in the lower inner quadrant of the left breast on a recent screening mammogram.   EXAM: DIGITAL DIAGNOSTIC UNILATERAL LEFT MAMMOGRAM WITH TOMOSYNTHESIS; ULTRASOUND LEFT BREAST LIMITED   TECHNIQUE: Left digital diagnostic mammography and breast  tomosynthesis was performed.; Targeted ultrasound examination of the left breast was performed.   COMPARISON:  Previous exam(s).   ACR Breast Density Category b: There are scattered areas of fibroglandular density.  FINDINGS: 3D tomographic and 2D generated spot compression images of the breast confirm multiple oval, mass-like areas in the medial left breast extending from the 10 o'clock to 6 o'clock position of the breast. These have mostly indistinct margins with some circumscribed margins.   On physical exam, no mass is palpable in the inferior medial left breast.   Targeted ultrasound is performed, showing masses in the left breast extending from the 10:00 to 6:00 positions of the breast as follows:   1.5 x 1.2 x 0.6 cm irregular, hypoechoic, mildly heterogeneous, solid-appearing mass with some circumscribed and some mildly indistinct margins in the 6 o'clock position, 1 cm from the nipple.   1.2 x 1.1 x 0.5 cm similar-appearing mass with an eccentric cystic component in the 8 o'clock position, 2 cm from the nipple.   0.8 x 0.8 x 0.4 cm adjacent, similar-appearing mass with more cystic components in the 8 o'clock position, 2 cm from the nipple.   1.2 x 0.6 x 0.5 cm similar-appearing mass which is predominantly solid in the 9 o'clock position, 5 cm from the nipple.   1.0 x 0.6 x 0.3 cm similar-appearing mass with minimal cystic component in the 10 o'clock position, 5 cm from the nipple.   0.8 x 0.6 x0.3 cm similar appearing mass with no definite cystic components in the 10 o'clock position, 5 cm from the nipple.   Ultrasound of the left axilla demonstrated normal appearing left axillary lymph nodes.   IMPRESSION: Indeterminate masses in the left breast extending from the 10:00 to 6:00 positions of the breast, as described above. The most suspicious mass is a 1.5 cm mass in the 6 o'clock position.   RECOMMENDATION: Ultrasound-guided core needle biopsy of the 1.5  cm mass in the 6 o'clock position of the left breast and ultrasound-guided core needle biopsy of the 1.0 cm mass in the 10 o'clock position of the left breast, 5 cm from the nipple. These masses are at the inferior and superior extents of the multiple masses respectively.   This has been discussed with the patient and the biopsies have been scheduled at 12:45 p.m. on 05/15/2022.   I have discussed the findings and recommendations with the patient. If applicable, a reminder letter will be sent to the patient regarding the next appointment.   BI-RADS CATEGORY  4: Suspicious.     Electronically Signed   By: Claudie Revering M.D.   On: 05/09/2022 17:10   Diagnosis 1. Breast, left, needle core biopsy, 6:00 1 cmfn INTRADUCTAL PAPILLOMA WITH FOCAL ATYPIA SEE NOTE 2. Breast, left, needle core biopsy, 12:00 5 cmfn FIBROCYSTIC CHANGES WITH USUAL DUCTAL HYPERPLASIA AND APOCRINE METAPLASIA. NEGATIVE FOR MALIGNANCY. SEE NOTE. Diagnosis Note 1. Dr. Alric Seton agrees. [place] was notified on 05/16/2022. Claudette Laws MD Pathologist, Electronic Signature (Case signed 05/16/2022)   Diagnosis Breast, left, needle core biopsy, 8:00 2 cmfn, heart shaped clip BENIGN BREAST WITH FIBROCYSTIC CHANGES INCLUDING CYSTIC DILATATION OF DUCTS, ADENOSIS AND CYSTIC PAPILLARY APOCRINE METAPLASIA NEGATIVE FOR MICROCALCIFICATIONS NEGATIVE FOR ATYPIA AND CARCINOMA Diagnosis Note Diagnosis called to Manuela Schwartz at [facility] by Dr. Alric Seton on 05/23/2022 at 8:54 AM. Tobin Chad MD Pathologist, Electronic Signature (Case signed 05/23/2022)   Assessment and Plan:  Diagnoses and all orders for this visit:   Intraductal papilloma of breast, left   This mass is located at 6:00, 1 cm from the nipple.   Recommend left breast radioactive seed localized lumpectomy.The surgical procedure has been discussed with  the patient.  Potential risks, benefits, alternative  treatments, and expected outcomes have been explained.  All of the patient's questions at this time have been answered.  The likelihood of reaching the patient's treatment goal is good.  The patient understand the proposed surgical procedure and wishes to proceed. We will perform the surgery through a circumareolar incision.   No follow-ups on file.   Delina Kruczek Jearld Adjutant, MD  06/01/2022 10:27 AM

## 2022-06-05 ENCOUNTER — Other Ambulatory Visit: Payer: Self-pay | Admitting: Surgery

## 2022-06-05 DIAGNOSIS — D242 Benign neoplasm of left breast: Secondary | ICD-10-CM

## 2022-07-13 ENCOUNTER — Encounter (HOSPITAL_BASED_OUTPATIENT_CLINIC_OR_DEPARTMENT_OTHER): Payer: Self-pay | Admitting: Surgery

## 2022-07-13 ENCOUNTER — Other Ambulatory Visit: Payer: Self-pay

## 2022-07-18 ENCOUNTER — Ambulatory Visit
Admission: RE | Admit: 2022-07-18 | Discharge: 2022-07-18 | Disposition: A | Discharge status: Home / Self Care | Payer: 59 | Source: Ambulatory Visit | Attending: Surgery | Admitting: Surgery

## 2022-07-18 DIAGNOSIS — D242 Benign neoplasm of left breast: Secondary | ICD-10-CM

## 2022-07-18 HISTORY — PX: BREAST BIOPSY: SHX20

## 2022-07-18 NOTE — Progress Notes (Signed)
Texted to remind patient to pick up pre-procedure soap and drink today

## 2022-07-18 NOTE — Progress Notes (Signed)
Patient was provided with CHG cleanser to use at home before the procedure. Patient verbalized understanding of instructions.      Patient Instructions  The night before surgery:  No food after midnight. ONLY clear liquids after midnight  The day of surgery (if you do NOT have diabetes):  Drink ONE (1) Pre-Surgery Clear Ensure as directed.   This drink was given to you during your hospital  pre-op appointment visit. The pre-op nurse will instruct you on the time to drink the  Pre-Surgery Ensure depending on your surgery time. Finish the drink at the designated time by the pre-op nurse.  Nothing else to drink after completing the  Pre-Surgery Clear Ensure.  The day of surgery (if you have diabetes): Drink ONE (1) Gatorade 2 (G2) as directed. This drink was given to you during your hospital  pre-op appointment visit.  The pre-op nurse will instruct you on the time to drink the   Gatorade 2 (G2) depending on your surgery time. Color of the Gatorade may vary. Red is not allowed. Nothing else to drink after completing the  Gatorade 2 (G2).         If you have questions, please contact your surgeon's office.      Patient Instructions  The night before surgery:  No food after midnight. ONLY clear liquids after midnight  The day of surgery (if you do NOT have diabetes):  Drink ONE (1) Pre-Surgery Clear Ensure as directed.   This drink was given to you during your hospital  pre-op appointment visit. The pre-op nurse will instruct you on the time to drink the  Pre-Surgery Ensure depending on your surgery time. Finish the drink at the designated time by the pre-op nurse.  Nothing else to drink after completing the  Pre-Surgery Clear Ensure.  The day of surgery (if you have diabetes): Drink ONE (1) Gatorade 2 (G2) as directed. This drink was given to you during your hospital  pre-op appointment visit.  The pre-op nurse will instruct you on the time to drink the   Gatorade  2 (G2) depending on your surgery time. Color of the Gatorade may vary. Red is not allowed. Nothing else to drink after completing the  Gatorade 2 (G2).         If you have questions, please contact your surgeon's office. 

## 2022-07-19 ENCOUNTER — Other Ambulatory Visit: Payer: Self-pay

## 2022-07-19 ENCOUNTER — Ambulatory Visit
Admission: RE | Admit: 2022-07-19 | Discharge: 2022-07-19 | Disposition: A | Discharge status: Home / Self Care | Payer: 59 | Source: Ambulatory Visit | Attending: Surgery | Admitting: Surgery

## 2022-07-19 ENCOUNTER — Ambulatory Visit (HOSPITAL_BASED_OUTPATIENT_CLINIC_OR_DEPARTMENT_OTHER)
Admission: RE | Admit: 2022-07-19 | Discharge: 2022-07-19 | Disposition: A | Discharge status: Home / Self Care | Payer: 59 | Attending: Surgery | Admitting: Surgery

## 2022-07-19 ENCOUNTER — Encounter (HOSPITAL_BASED_OUTPATIENT_CLINIC_OR_DEPARTMENT_OTHER): Payer: Self-pay | Admitting: Surgery

## 2022-07-19 ENCOUNTER — Ambulatory Visit (HOSPITAL_BASED_OUTPATIENT_CLINIC_OR_DEPARTMENT_OTHER): Payer: 59 | Admitting: Anesthesiology

## 2022-07-19 ENCOUNTER — Encounter (HOSPITAL_BASED_OUTPATIENT_CLINIC_OR_DEPARTMENT_OTHER)
Admission: RE | Disposition: A | Discharge status: Home / Self Care | Payer: Self-pay | Source: Home / Self Care | Attending: Surgery

## 2022-07-19 DIAGNOSIS — I10 Essential (primary) hypertension: Secondary | ICD-10-CM | POA: Diagnosis not present

## 2022-07-19 DIAGNOSIS — D242 Benign neoplasm of left breast: Secondary | ICD-10-CM | POA: Diagnosis present

## 2022-07-19 DIAGNOSIS — K219 Gastro-esophageal reflux disease without esophagitis: Secondary | ICD-10-CM | POA: Diagnosis not present

## 2022-07-19 DIAGNOSIS — G709 Myoneural disorder, unspecified: Secondary | ICD-10-CM | POA: Diagnosis not present

## 2022-07-19 DIAGNOSIS — G4730 Sleep apnea, unspecified: Secondary | ICD-10-CM | POA: Diagnosis not present

## 2022-07-19 DIAGNOSIS — Z79899 Other long term (current) drug therapy: Secondary | ICD-10-CM | POA: Diagnosis not present

## 2022-07-19 DIAGNOSIS — N6012 Diffuse cystic mastopathy of left breast: Secondary | ICD-10-CM | POA: Insufficient documentation

## 2022-07-19 DIAGNOSIS — Z87891 Personal history of nicotine dependence: Secondary | ICD-10-CM | POA: Diagnosis not present

## 2022-07-19 DIAGNOSIS — Z01818 Encounter for other preprocedural examination: Secondary | ICD-10-CM

## 2022-07-19 DIAGNOSIS — Z8249 Family history of ischemic heart disease and other diseases of the circulatory system: Secondary | ICD-10-CM | POA: Insufficient documentation

## 2022-07-19 DIAGNOSIS — N6022 Fibroadenosis of left breast: Secondary | ICD-10-CM | POA: Diagnosis not present

## 2022-07-19 HISTORY — DX: Prediabetes: R73.03

## 2022-07-19 HISTORY — PX: RADIOACTIVE SEED GUIDED EXCISIONAL BREAST BIOPSY: SHX6490

## 2022-07-19 HISTORY — DX: Hypothyroidism, unspecified: E03.9

## 2022-07-19 SURGERY — RADIOACTIVE SEED GUIDED BREAST BIOPSY
Anesthesia: General | Site: Breast | Laterality: Left

## 2022-07-19 MED ORDER — CEFAZOLIN SODIUM-DEXTROSE 2-4 GM/100ML-% IV SOLN
INTRAVENOUS | Status: AC
  Filled 2022-07-19: qty 100

## 2022-07-19 MED ORDER — OXYCODONE HCL 5 MG PO TABS: 5.0000 mg | ORAL_TABLET | Freq: Once | ORAL | Status: DC | PRN

## 2022-07-19 MED ORDER — MIDAZOLAM HCL 2 MG/2ML IJ SOLN
INTRAMUSCULAR | Status: DC | PRN
  Administered 2022-07-19: 2 mg via INTRAVENOUS

## 2022-07-19 MED ORDER — CEFAZOLIN SODIUM-DEXTROSE 2-4 GM/100ML-% IV SOLN: 2.0000 g | INTRAVENOUS | Status: DC

## 2022-07-19 MED ORDER — AMISULPRIDE (ANTIEMETIC) 5 MG/2ML IV SOLN: 10.0000 mg | Freq: Once | INTRAVENOUS | Status: DC | PRN

## 2022-07-19 MED ORDER — BUPIVACAINE HCL (PF) 0.25 % IJ SOLN
INTRAMUSCULAR | Status: DC | PRN
  Administered 2022-07-19: 10 mL

## 2022-07-19 MED ORDER — PROPOFOL 10 MG/ML IV BOLUS
INTRAVENOUS | Status: DC | PRN
  Administered 2022-07-19: 150 mg via INTRAVENOUS

## 2022-07-19 MED ORDER — ONDANSETRON HCL 4 MG/2ML IJ SOLN
INTRAMUSCULAR | Status: AC
  Filled 2022-07-19: qty 2

## 2022-07-19 MED ORDER — DEXAMETHASONE SODIUM PHOSPHATE 10 MG/ML IJ SOLN
INTRAMUSCULAR | Status: DC | PRN
  Administered 2022-07-19: 5 mg via INTRAVENOUS

## 2022-07-19 MED ORDER — DEXAMETHASONE SODIUM PHOSPHATE 10 MG/ML IJ SOLN
INTRAMUSCULAR | Status: AC
  Filled 2022-07-19: qty 1

## 2022-07-19 MED ORDER — FENTANYL CITRATE (PF) 100 MCG/2ML IJ SOLN
INTRAMUSCULAR | Status: AC
  Filled 2022-07-19: qty 2

## 2022-07-19 MED ORDER — ONDANSETRON HCL 4 MG/2ML IJ SOLN
INTRAMUSCULAR | Status: DC | PRN
  Administered 2022-07-19: 4 mg via INTRAVENOUS

## 2022-07-19 MED ORDER — FENTANYL CITRATE (PF) 100 MCG/2ML IJ SOLN
INTRAMUSCULAR | Status: DC | PRN
  Administered 2022-07-19: 50 ug via INTRAVENOUS

## 2022-07-19 MED ORDER — LIDOCAINE 2% (20 MG/ML) 5 ML SYRINGE
INTRAMUSCULAR | Status: AC
  Filled 2022-07-19: qty 5

## 2022-07-19 MED ORDER — ALBUMIN HUMAN 5 % IV SOLN: INTRAVENOUS | Status: DC | PRN

## 2022-07-19 MED ORDER — ACETAMINOPHEN 500 MG PO TABS
1000.0000 mg | ORAL_TABLET | ORAL | Status: AC
  Administered 2022-07-19: 1000 mg via ORAL

## 2022-07-19 MED ORDER — OXYCODONE HCL 5 MG/5ML PO SOLN: 5.0000 mg | Freq: Once | ORAL | Status: DC | PRN

## 2022-07-19 MED ORDER — EPHEDRINE SULFATE (PRESSORS) 50 MG/ML IJ SOLN
INTRAMUSCULAR | Status: DC | PRN
  Administered 2022-07-19 (×5): 10 mg via INTRAVENOUS

## 2022-07-19 MED ORDER — ALBUMIN HUMAN 5 % IV SOLN
INTRAVENOUS | Status: AC
  Filled 2022-07-19: qty 500

## 2022-07-19 MED ORDER — PHENYLEPHRINE HCL (PRESSORS) 10 MG/ML IV SOLN
INTRAVENOUS | Status: DC | PRN
  Administered 2022-07-19: 240 ug via INTRAVENOUS
  Administered 2022-07-19: 160 ug via INTRAVENOUS
  Administered 2022-07-19 (×3): 240 ug via INTRAVENOUS

## 2022-07-19 MED ORDER — FENTANYL CITRATE (PF) 100 MCG/2ML IJ SOLN: 25.0000 ug | INTRAMUSCULAR | Status: DC | PRN

## 2022-07-19 MED ORDER — LACTATED RINGERS IV SOLN: INTRAVENOUS | Status: DC

## 2022-07-19 MED ORDER — LACTATED RINGERS IV SOLN: INTRAVENOUS | Status: DC | PRN

## 2022-07-19 MED ORDER — CHLORHEXIDINE GLUCONATE CLOTH 2 % EX PADS: 6.0000 | MEDICATED_PAD | Freq: Once | CUTANEOUS | Status: DC

## 2022-07-19 MED ORDER — LIDOCAINE HCL (CARDIAC) PF 100 MG/5ML IV SOSY
PREFILLED_SYRINGE | INTRAVENOUS | Status: DC | PRN
  Administered 2022-07-19: 20 mg via INTRAVENOUS

## 2022-07-19 MED ORDER — PROPOFOL 10 MG/ML IV BOLUS
INTRAVENOUS | Status: AC
  Filled 2022-07-19: qty 20

## 2022-07-19 MED ORDER — CEFAZOLIN SODIUM-DEXTROSE 2-3 GM-%(50ML) IV SOLR
INTRAVENOUS | Status: DC | PRN
  Administered 2022-07-19: 2 g via INTRAVENOUS

## 2022-07-19 MED ORDER — ACETAMINOPHEN 500 MG PO TABS
ORAL_TABLET | ORAL | Status: AC
  Filled 2022-07-19: qty 2

## 2022-07-19 SURGICAL SUPPLY — 45 items
APL PRP STRL LF DISP 70% ISPRP (MISCELLANEOUS) ×1
APL SKNCLS STERI-STRIP NONHPOA (GAUZE/BANDAGES/DRESSINGS) ×1
APPLIER CLIP 9.375 MED OPEN (MISCELLANEOUS)
APR CLP MED 9.3 20 MLT OPN (MISCELLANEOUS)
BENZOIN TINCTURE PRP APPL 2/3 (GAUZE/BANDAGES/DRESSINGS) ×1 IMPLANT
BLADE HEX COATED 2.75 (ELECTRODE) ×1 IMPLANT
BLADE SURG 15 STRL LF DISP TIS (BLADE) ×1 IMPLANT
BLADE SURG 15 STRL SS (BLADE) ×1
CANISTER SUCT 1200ML W/VALVE (MISCELLANEOUS) ×1 IMPLANT
CHLORAPREP W/TINT 26 (MISCELLANEOUS) ×1 IMPLANT
CLIP APPLIE 9.375 MED OPEN (MISCELLANEOUS) IMPLANT
COVER BACK TABLE 60X90IN (DRAPES) ×1 IMPLANT
COVER MAYO STAND STRL (DRAPES) ×1 IMPLANT
COVER PROBE CYLINDRICAL 5X96 (MISCELLANEOUS) ×1 IMPLANT
DRAPE LAPAROTOMY 100X72 PEDS (DRAPES) ×1 IMPLANT
DRAPE UTILITY XL STRL (DRAPES) ×1 IMPLANT
DRSG TEGADERM 4X4.75 (GAUZE/BANDAGES/DRESSINGS) ×1 IMPLANT
ELECT REM PT RETURN 9FT ADLT (ELECTROSURGICAL) ×1
ELECTRODE REM PT RTRN 9FT ADLT (ELECTROSURGICAL) ×1 IMPLANT
GAUZE SPONGE 2X2 STRL 8-PLY (GAUZE/BANDAGES/DRESSINGS) IMPLANT
GAUZE SPONGE 4X4 12PLY STRL LF (GAUZE/BANDAGES/DRESSINGS) ×1 IMPLANT
GLOVE BIO SURGEON STRL SZ7 (GLOVE) ×1 IMPLANT
GLOVE BIOGEL PI IND STRL 7.5 (GLOVE) ×1 IMPLANT
GOWN STRL REUS W/ TWL LRG LVL3 (GOWN DISPOSABLE) ×2 IMPLANT
GOWN STRL REUS W/TWL LRG LVL3 (GOWN DISPOSABLE) ×2
KIT MARKER MARGIN INK (KITS) ×1 IMPLANT
NDL HYPO 25X1 1.5 SAFETY (NEEDLE) ×1 IMPLANT
NEEDLE HYPO 25X1 1.5 SAFETY (NEEDLE) ×1 IMPLANT
NS IRRIG 1000ML POUR BTL (IV SOLUTION) ×1 IMPLANT
PACK BASIN DAY SURGERY FS (CUSTOM PROCEDURE TRAY) ×1 IMPLANT
PENCIL SMOKE EVACUATOR (MISCELLANEOUS) ×1 IMPLANT
SLEEVE SCD COMPRESS KNEE MED (STOCKING) ×1 IMPLANT
SPIKE FLUID TRANSFER (MISCELLANEOUS) IMPLANT
SPONGE T-LAP 18X18 ~~LOC~~+RFID (SPONGE) IMPLANT
SPONGE T-LAP 4X18 ~~LOC~~+RFID (SPONGE) ×1 IMPLANT
STRIP CLOSURE SKIN 1/2X4 (GAUZE/BANDAGES/DRESSINGS) ×1 IMPLANT
SUT MON AB 4-0 PC3 18 (SUTURE) ×1 IMPLANT
SUT SILK 2 0 SH (SUTURE) IMPLANT
SUT VIC AB 3-0 SH 27 (SUTURE) ×1
SUT VIC AB 3-0 SH 27X BRD (SUTURE) ×1 IMPLANT
SYR CONTROL 10ML LL (SYRINGE) ×1 IMPLANT
TOWEL GREEN STERILE FF (TOWEL DISPOSABLE) ×1 IMPLANT
TRAY FAXITRON CT DISP (TRAY / TRAY PROCEDURE) ×1 IMPLANT
TUBE CONNECTING 20X1/4 (TUBING) ×1 IMPLANT
YANKAUER SUCT BULB TIP NO VENT (SUCTIONS) ×1 IMPLANT

## 2022-07-19 NOTE — Op Note (Signed)
Pre-op Diagnosis:  Left breast intraductal papilloma Post-op Diagnosis: same Procedure:  Left radioactive seed localized lumpectomy Surgeon:  Yacob Wilkerson K. Anesthesia:  GEN - LMA Indications:  This is a 57 year old female who recently underwent routine screening mammogram.  This showed possible masses in the left medial breast.  She underwent further workup with diagnostic mammogram and ultrasound.  This showed multiple masses in the medial left breast from 6:00 to 12:00.  At 6:00, she had a 1.5 x 1.2 x 0.6 cm solid mass located 1 cm from the nipple.  She underwent subsequent biopsy of this area that revealed intraductal papilloma. At 8:00, 2 cm from the nipple, she had a 1.2 x 1.1 x 0.5 cm mass that was biopsied and revealed fibrocystic changes.  Adjacent to this was a 8 mm mass that was not biopsied.  At 9:00 5 cm from the nipple, there is a 1.2 x 0.6 x 0.5 cm mass that was not biopsied.  At 12:00, she had a 1.0 x 0.6 x 0.3 cm mass that was biopsied and revealed fibrocystic changes.  She was then referred to [place] to discuss excision of the intraductal papilloma.  The patient denies any symptoms.  No family history of breast cancer. Description of procedure: The patient is brought to the operating room placed in supine position on the operating room table. After an adequate level of general anesthesia was obtained, her left breast was prepped with ChloraPrep and draped in sterile fashion. A timeout was taken to ensure the proper patient and proper procedure. We interrogated the breast with the neoprobe. We made a circumareolar incision around the lower side of the nipple after infiltrating with 0.25% Marcaine. Dissection was carried down in the breast tissue with cautery. We used the neoprobe to guide [place] towards the radioactive seed. We excised an area of tissue around the radioactive seed 2 cm in diameter. The specimen was removed and was oriented with a paint kit. Specimen mammogram showed the radioactive  seed as well as the biopsy clip within the specimen. This was sent for pathologic examination. There is no residual radioactivity within the biopsy cavity. We inspected carefully for hemostasis. The wound was thoroughly irrigated. The wound was closed with a deep layer of 3-0 Vicryl and a subcuticular layer of 4-0 Monocryl. Benzoin Steri-Strips were applied. The patient was then extubated and brought to the recovery room in stable condition. All sponge, instrument, and needle counts are correct.  Wilmon Arms. Corliss Skains, MD, [facility]  General/ Trauma Surgery  07/19/2022 9:47 AM

## 2022-07-19 NOTE — Anesthesia Preprocedure Evaluation (Signed)
Anesthesia Evaluation  Patient identified by MRN, date of birth, ID band Patient awake    Reviewed: Allergy & Precautions, NPO status , Patient's Chart, lab work & pertinent test results  Airway Mallampati: II  TM Distance: >3 FB Neck ROM: Full    Dental   Pulmonary sleep apnea , former smoker   Pulmonary exam normal        Cardiovascular hypertension, Pt. on medications  Rhythm:Regular Rate:Normal     Neuro/Psych  Neuromuscular disease    GI/Hepatic Neg liver ROS,GERD  ,,  Endo/Other  Hypothyroidism    Renal/GU negative Renal ROS     Musculoskeletal   Abdominal   Peds  Hematology negative hematology ROS (+)   Anesthesia Other Findings   Reproductive/Obstetrics                             Anesthesia Physical Anesthesia Plan  ASA: 2  Anesthesia Plan: General   Post-op Pain Management: Tylenol PO (pre-op)* and Toradol IV (intra-op)*   Induction: Intravenous  PONV Risk Score and Plan: 3 and Dexamethasone, Ondansetron, Midazolam and Treatment may vary due to age or medical condition  Airway Management Planned: LMA  Additional Equipment: None  Intra-op Plan:   Post-operative Plan: Extubation in OR  Informed Consent: I have reviewed the patients History and Physical, chart, labs and discussed the procedure including the risks, benefits and alternatives for the proposed anesthesia with the patient or authorized representative who has indicated his/her understanding and acceptance.     Dental advisory given  Plan Discussed with: CRNA  Anesthesia Plan Comments:        Anesthesia Quick Evaluation

## 2022-07-19 NOTE — Discharge Instructions (Addendum)
[facility] Office Phone Number [phone]  BREAST BIOPSY/ PARTIAL MASTECTOMY: POST OP INSTRUCTIONS  Always review your discharge instruction sheet given to you by the facility where your surgery was performed.  IF YOU HAVE DISABILITY OR FAMILY LEAVE FORMS, YOU MUST BRING THEM TO THE OFFICE FOR PROCESSING.  DO NOT GIVE THEM TO YOUR DOCTOR.  A prescription for pain medication may be given to you upon discharge.  Take your pain medication as prescribed, if needed.  If narcotic pain medicine is not needed, then you may take acetaminophen (Tylenol) or ibuprofen (Advil) as needed. Take your usually prescribed medications unless otherwise directed If you need a refill on your pain medication, please contact your pharmacy.  They will contact our office to request authorization.  Prescriptions will not be filled after 5pm or on week-ends. You should eat very light the first 24 hours after surgery, such as soup, crackers, pudding, etc.  Resume your normal diet the day after surgery. Most patients will experience some swelling and bruising in the breast.  Ice packs and a good support bra will help.  Swelling and bruising can take several days to resolve.  It is common to experience some constipation if taking pain medication after surgery.  Increasing fluid intake and taking a stool softener will usually help or prevent this problem from occurring.  A mild laxative (Milk of Magnesia or Miralax) should be taken according to package directions if there are no bowel movements after 48 hours. Unless discharge instructions indicate otherwise, you may remove your bandages 24-48 hours after surgery, and you may shower at that time.  You may have steri-strips (small skin tapes) in place directly over the incision.  These strips should be left on the skin for 7-10 days.  If your surgeon used skin glue on the incision, you may shower in 24 hours.  The glue will flake off over the next 2-3 weeks.  Any  sutures or staples will be removed at the office during your follow-up visit. ACTIVITIES:  You may resume regular daily activities (gradually increasing) beginning the next day.  Wearing a good support bra or sports bra minimizes pain and swelling.  You may have sexual intercourse when it is comfortable. You may drive when you no longer are taking prescription pain medication, you can comfortably wear a seatbelt, and you can safely maneuver your car and apply brakes. RETURN TO WORK:  ______________________________________________________________________________________ Ashley Bright should see your doctor in the office for a follow-up appointment approximately two weeks after your surgery.  Your doctor's nurse will typically make your follow-up appointment when she calls you with your pathology report.  Expect your pathology report 2-3 business days after your surgery.  You may call to check if you do not hear from [place] after three days. OTHER INSTRUCTIONS: _______________________________________________________________________________________________ _____________________________________________________________________________________________________________________________________ _____________________________________________________________________________________________________________________________________ _____________________________________________________________________________________________________________________________________  WHEN TO CALL YOUR DOCTOR: Fever over 101.0 Nausea and/or vomiting. Extreme swelling or bruising. Continued bleeding from incision. Increased pain, redness, or drainage from the incision.  The clinic staff is available to answer your questions during regular business hours.  Please don't hesitate to call and ask to speak to one of the nurses for clinical concerns.  If you have a medical emergency, go to the nearest emergency room or call 911.  A surgeon from [facility] is always on call at the hospital.  For further questions, please visit [email or web address]  No tylenol until 1:30   Post Anesthesia Home Care Instructions  Activity: Get plenty of rest  for the remainder of the day. A responsible individual must stay with you for 24 hours following the procedure.  For the next 24 hours, DO NOT: -Drive a car -Advertising copywriter -Drink alcoholic beverages -Take any medication unless instructed by your physician -Make any legal decisions or sign important papers.  Meals: Start with liquid foods such as gelatin or soup. Progress to regular foods as tolerated. Avoid greasy, spicy, heavy foods. If nausea and/or vomiting occur, drink only clear liquids until the nausea and/or vomiting subsides. Call your physician if vomiting continues.  Special Instructions/Symptoms: Your throat may feel dry or sore from the anesthesia or the breathing tube placed in your throat during surgery. If this causes discomfort, gargle with warm salt water. The discomfort should disappear within 24 hours.  If you had a scopolamine patch placed behind your ear for the management of post- operative nausea and/or vomiting:  1. The medication in the patch is effective for 72 hours, after which it should be removed.  Wrap patch in a tissue and discard in the trash. Wash hands thoroughly with soap and water. 2. You may remove the patch earlier than 72 hours if you experience unpleasant side effects which may include dry mouth, dizziness or visual disturbances. 3. Avoid touching the patch. Wash your hands with soap and water after contact with the patch.

## 2022-07-19 NOTE — Anesthesia Postprocedure Evaluation (Signed)
Anesthesia Post Note  Patient: IT consultant  Procedure(s) Performed: RADIOACTIVE SEED GUIDED EXCISIONAL LEFT BREAST BIOPSY (Left: Breast)     Patient location during evaluation: PACU Anesthesia Type: General Level of consciousness: awake and alert Pain management: pain level controlled Vital Signs Assessment: post-procedure vital signs reviewed and stable Respiratory status: spontaneous breathing, nonlabored ventilation, respiratory function stable and patient connected to nasal cannula oxygen Cardiovascular status: blood pressure returned to baseline and stable Postop Assessment: no apparent nausea or vomiting Anesthetic complications: no  No notable events documented.  Last Vitals:  Vitals:   07/19/22 1015 07/19/22 1035  BP: 101/64 115/71  Pulse: 73 80  Resp: 11 16  Temp:  (!) 36.2 C  SpO2: 100% 100%    Last Pain:  Vitals:   07/19/22 1035  TempSrc: Temporal  PainSc: 0-No pain                 Kennieth Rad

## 2022-07-19 NOTE — Transfer of Care (Signed)
Immediate Anesthesia Transfer of Care Note  Patient: IT consultant  Procedure(s) Performed: RADIOACTIVE SEED GUIDED EXCISIONAL LEFT BREAST BIOPSY (Left: Breast)  Patient Location: PACU  Anesthesia Type:General  Level of Consciousness: awake, alert , and oriented  Airway & Oxygen Therapy: Patient Spontanous Breathing  Post-op Assessment: Report given to RN and Post -op Vital signs reviewed and stable  Post vital signs: Reviewed and stable  Last Vitals:  Vitals Value Taken Time  BP 115/75 07/19/22 0952  Temp    Pulse 74 07/19/22 0953  Resp 12 07/19/22 0953  SpO2 100 % 07/19/22 0953  Vitals shown include unvalidated device data.  Last Pain:  Vitals:   07/19/22 0722  TempSrc: Oral  PainSc: 0-No pain      Patients Stated Pain Goal: 4 (07/19/22 8115)  Complications: No notable events documented.

## 2022-07-19 NOTE — H&P (Signed)
Chief Complaint: Breast Mass       History of Present Illness: Ashley Bright is a 57 y.o. female who is seen today as an office consultation at the request of Dr. Dareen PianoAnderson for evaluation of Breast Mass .   This is a 57 year old female who recently underwent routine screening mammogram.  This showed possible masses in the left medial breast.  She underwent further workup with diagnostic mammogram and ultrasound.  This showed multiple masses in the medial left breast from 6:00 to 12:00.  At 6:00, she had a 1.5 x 1.2 x 0.6 cm solid mass located 1 cm from the nipple.  She underwent subsequent biopsy of this area that revealed intraductal papilloma. At 8:00, 2 cm from the nipple, she had a 1.2 x 1.1 x 0.5 cm mass that was biopsied and revealed fibrocystic changes.  Adjacent to this was a 8 mm mass that was not biopsied.  At 9:00 5 cm from the nipple, there is a 1.2 x 0.6 x 0.5 cm mass that was not biopsied.  At 12:00, she had a 1.0 x 0.6 x 0.3 cm mass that was biopsied and revealed fibrocystic changes.  She was then referred to us to discuss excision of the intraductal papilloma.  The patient denies any symptoms.  No family history of breast cancer.     Review of Systems: A complete review of systems was obtained from the patient.  I have reviewed this information and discussed as appropriate with the patient.  See HPI as well for other ROS.   Review of Systems  Constitutional: Negative.   HENT: Negative.    Eyes: Negative.   Respiratory: Negative.    Cardiovascular: Negative.   Gastrointestinal: Negative.   Genitourinary: Negative.   Musculoskeletal: Negative.   Skin: Negative.   Neurological: Negative.   Endo/Heme/Allergies: Negative.   Psychiatric/Behavioral:  Positive for depression. The patient is nervous/anxious.         Medical History: Past Medical History         Past Medical History:  Diagnosis Date   Arthritis     GERD (gastroesophageal reflux disease)     Glaucoma  (increased eye pressure)     Hypertension     Sleep apnea               Patient Active Problem List  Diagnosis   Abnormal TSH   Attention deficit disorder   Carpal tunnel syndrome, bilateral   Cervical radiculitis   Class 1 obesity due to excess calories with serious comorbidity and body mass index (BMI) of 33.0 to 33.9 in adult   Essential hypertension   Generalized anxiety disorder   History of migraine   Hyperglycemia   MDD (major depressive disorder)      Past Surgical History           Past Surgical History:  Procedure Laterality Date   CHOLECYSTECTOMY       ENDOSCOPIC CARPAL TUNNEL RELEASE            Allergies  No Known Allergies                 Current Outpatient Medications on File Prior to Visit  Medication Sig Dispense Refill   amLODIPine (NORVASC) 5 MG tablet Take 1 tablet by mouth once daily       cyclobenzaprine (FLEXERIL) 5 MG tablet         estradioL (ESTRACE) 2 MG tablet Take 1 tablet by mouth once daily  metoprolol succinate (TOPROL-XL) 100 MG XL tablet Take 1 tablet by mouth once daily       omeprazole (PRILOSEC) 40 MG DR capsule TAKE ONE CAPSULE (40 MG TOTAL) BY MOUTH 2 (TWO) TIMES DAILY.       potassium chloride (KLOR-CON) 20 MEQ ER tablet         pravastatin (PRAVACHOL) 40 MG tablet Take 1 tablet by mouth at bedtime       valsartan-hydroCHLOROthiazide (DIOVAN-HCT) 320-12.5 mg tablet Take 1 tablet by mouth once daily       ARIPiprazole (ABILIFY) 2 MG tablet Take by mouth       AUVELITY 45-105 mg TbIE         dextroamphetamine-amphetamine (ADDERALL XR) 30 MG XR capsule TAKE 2 CAPSULES BY MOUTH IN THE MORNING        No current facility-administered medications on file prior to visit.      Family History           Family History  Problem Relation Age of Onset   High blood pressure (Hypertension) Mother     Hyperlipidemia (Elevated cholesterol) Mother     Coronary Artery Disease (Blocked arteries around heart) Mother     Diabetes Mother      High blood pressure (Hypertension) Brother     Coronary Artery Disease (Blocked arteries around heart) Brother          Social History           Tobacco Use  Smoking Status Former   Types: Cigarettes  Smokeless Tobacco Never      Social History  Social History             Socioeconomic History   Marital status: Divorced  Tobacco Use   Smoking status: Former      Types: Cigarettes   Smokeless tobacco: Never  Vaping Use   Vaping Use: Never used  Substance and Sexual Activity   Alcohol use: Never   Drug use: Never        Objective:           Vitals:    06/01/22 1014  BP: 124/80  Pulse: 85  Temp: 36.7 C (98 F)  SpO2: 98%  Weight: 84.8 kg (187 lb)  Height: 157.5 cm (5\' 2" )  PainSc: 0-No pain    Body mass index is 34.2 kg/m.   Physical Exam    Constitutional:  WDWN in NAD, conversant, no obvious deformities; lying in bed comfortably Eyes:  Pupils equal, round; sclera anicteric; moist conjunctiva; no lid lag HENT:  Oral mucosa moist; good dentition  Neck:  No masses palpated, trachea midline; no thyromegaly Lungs:  CTA bilaterally; normal respiratory effort Breasts:  symmetric, no nipple changes; no palpable masses or lymphadenopathy on either side CV:  Regular rate and rhythm; no murmurs; extremities well-perfused with no edema Abd:  +bowel sounds, soft, non-tender, no palpable organomegaly; no palpable hernias Musc:  Normal gait; no apparent clubbing or cyanosis in extremities Lymphatic:  No palpable cervical or axillary lymphadenopathy Skin:  Warm, dry; no sign of jaundice Psychiatric - alert and oriented x 4; calm mood and affect     Labs, Imaging and Diagnostic Testing: CLINICAL DATA:  Possible masses in the lower inner quadrant of the left breast on a recent screening mammogram.   EXAM: DIGITAL DIAGNOSTIC UNILATERAL LEFT MAMMOGRAM WITH TOMOSYNTHESIS; ULTRASOUND LEFT BREAST LIMITED   TECHNIQUE: Left digital diagnostic mammography and  breast tomosynthesis was performed.; Targeted ultrasound examination of the left breast was  performed.   COMPARISON:  Previous exam(s).   ACR Breast Density Category b: There are scattered areas of fibroglandular density.   FINDINGS: 3D tomographic and 2D generated spot compression images of the breast confirm multiple oval, mass-like areas in the medial left breast extending from the 10 o'clock to 6 o'clock position of the breast. These have mostly indistinct margins with some circumscribed margins.   On physical exam, no mass is palpable in the inferior medial left breast.   Targeted ultrasound is performed, showing masses in the left breast extending from the 10:00 to 6:00 positions of the breast as follows:   1.5 x 1.2 x 0.6 cm irregular, hypoechoic, mildly heterogeneous, solid-appearing mass with some circumscribed and some mildly indistinct margins in the 6 o'clock position, 1 cm from the nipple.   1.2 x 1.1 x 0.5 cm similar-appearing mass with an eccentric cystic component in the 8 o'clock position, 2 cm from the nipple.   0.8 x 0.8 x 0.4 cm adjacent, similar-appearing mass with more cystic components in the 8 o'clock position, 2 cm from the nipple.   1.2 x 0.6 x 0.5 cm similar-appearing mass which is predominantly solid in the 9 o'clock position, 5 cm from the nipple.   1.0 x 0.6 x 0.3 cm similar-appearing mass with minimal cystic component in the 10 o'clock position, 5 cm from the nipple.   0.8 x 0.6 x0.3 cm similar appearing mass with no definite cystic components in the 10 o'clock position, 5 cm from the nipple.   Ultrasound of the left axilla demonstrated normal appearing left axillary lymph nodes.   IMPRESSION: Indeterminate masses in the left breast extending from the 10:00 to 6:00 positions of the breast, as described above. The most suspicious mass is a 1.5 cm mass in the 6 o'clock position.   RECOMMENDATION: Ultrasound-guided core needle biopsy of  the 1.5 cm mass in the 6 o'clock position of the left breast and ultrasound-guided core needle biopsy of the 1.0 cm mass in the 10 o'clock position of the left breast, 5 cm from the nipple. These masses are at the inferior and superior extents of the multiple masses respectively.   This has been discussed with the patient and the biopsies have been scheduled at 12:45 p.m. on 05/15/2022.   I have discussed the findings and recommendations with the patient. If applicable, a reminder letter will be sent to the patient regarding the next appointment.   BI-RADS CATEGORY  4: Suspicious.     Electronically Signed   By: Beckie Salts M.D.   On: 05/09/2022 17:10   Diagnosis 1. Breast, left, needle core biopsy, 6:00 1 cmfn INTRADUCTAL PAPILLOMA WITH FOCAL ATYPIA SEE NOTE 2. Breast, left, needle core biopsy, 12:00 5 cmfn FIBROCYSTIC CHANGES WITH USUAL DUCTAL HYPERPLASIA AND APOCRINE METAPLASIA. NEGATIVE FOR MALIGNANCY. SEE NOTE. Diagnosis Note 1. Dr. Venetia Night agrees. [facility] was notified on 05/16/2022. Jimmy Picket MD Pathologist, Electronic Signature (Case signed 05/16/2022)   Diagnosis Breast, left, needle core biopsy, 8:00 2 cmfn, heart shaped clip BENIGN BREAST WITH FIBROCYSTIC CHANGES INCLUDING CYSTIC DILATATION OF DUCTS, ADENOSIS AND CYSTIC PAPILLARY APOCRINE METAPLASIA NEGATIVE FOR MICROCALCIFICATIONS NEGATIVE FOR ATYPIA AND CARCINOMA Diagnosis Note Diagnosis called to Darl Pikes at [facility] by Dr. Venetia Night on 05/23/2022 at 8:54 AM. Jerene Bears MD Pathologist, Electronic Signature (Case signed 05/23/2022)   Assessment and Plan:  Diagnoses and all orders for this visit:   Intraductal papilloma of breast, left   This mass is  located at 6:00, 1 cm from the nipple.   Recommend left breast radioactive seed localized lumpectomy.The surgical procedure has been discussed with the patient.  Potential risks, benefits,  alternative treatments, and expected outcomes have been explained.  All of the patient's questions at this time have been answered.  The likelihood of reaching the patient's treatment goal is good.  The patient understand the proposed surgical procedure and wishes to proceed. We will perform the surgery through a circumareolar incision.  Wilmon Arms. Corliss Skains, MD, [facility] Surgery  General Surgery   07/19/2022 7:23 AM

## 2022-07-19 NOTE — Anesthesia Procedure Notes (Signed)
Procedure Name: LMA Insertion Date/Time: 07/19/2022 9:19 AM  Performed by: Karen Kitchens, CRNAPre-anesthesia Checklist: Patient identified, Emergency Drugs available, Suction available and Patient being monitored Patient Re-evaluated:Patient Re-evaluated prior to induction Oxygen Delivery Method: Circle system utilized Preoxygenation: Pre-oxygenation with 100% oxygen Induction Type: IV induction Ventilation: Mask ventilation without difficulty LMA: LMA inserted LMA Size: 4.0 Number of attempts: 1 Airway Equipment and Method: Bite block Placement Confirmation: positive ETCO2, CO2 detector and breath sounds checked- equal and bilateral Tube secured with: Tape Dental Injury: Teeth and Oropharynx as per pre-operative assessment

## 2022-07-20 ENCOUNTER — Encounter (HOSPITAL_BASED_OUTPATIENT_CLINIC_OR_DEPARTMENT_OTHER): Payer: Self-pay | Admitting: Surgery

## 2022-07-20 LAB — SURGICAL PATHOLOGY

## 2022-10-23 NOTE — Progress Notes (Signed)
Office: [phone]  /  Fax: [phone]   TeleHealth Visit:  This visit was completed with telemedicine (audio/video) technology. [name] has verbally consented to this TeleHealth visit. The patient is located at home, the provider is located at home. The participants in this visit include the listed provider and patient. The visit was conducted today via MyChart video.  Initial Visit  Ashley Bright was seen via virtual visit today to evaluate for treatment of obesity. She is interested in losing weight to improve overall health and reduce the risk of weight related complications. She presents today to review program treatment options, initial physical assessment, and evaluation.     Height: 5\' 2"  Weight: 168 lbs BMI: 30.76  She was referred by: PCP   When asked what else they would like to accomplish? She states: {EMHopetoaccomplish:28304}  Weight history:  She has been working on weight loss with diet and exercise for the last few months and has lost 17 pounds (from 185 down to 168).  When asked how has your weight affected you? She states: {EMWeightAffected:28305}  Some associated conditions: {EMSomeConditions:28306}  Contributing factors: {EMcontributingfactors:28307}  Weight promoting medications identified: {EMWeightpromotingrx:28308}  Current nutrition plan: {EMNutritionplan:28309::"None"}  Current level of physical activity: Walking  Current or previous pharmacotherapy: {EM previousRx:28311}  Response to medication: {EMResponsetomedication:28312}  Past medical history includes:   Past Medical History:  Diagnosis Date   ADD (attention deficit disorder)    Anxiety    Complication of anesthesia    pt states after her cholecystectomy her mouth got really sore and red. She thought it was thrush but MD thought she was allergic to something on tube.   Depression    GERD (gastroesophageal reflux disease)    Glaucoma    High cholesterol    Hypertension    Hypothyroidism     Pre-diabetes    Sleep apnea      Objective:     General:  Alert, oriented and cooperative. Patient is in no acute distress.  Respiratory: Normal respiratory effort, no problems with respiration noted  Mental Status: Normal mood and affect. Normal behavior. Normal judgment and thought content.    Assessment and Plan:   1. Prediabetes Last A1c was 5.8 on 05/24/22.  Medication(s): {dwwpharmacotherapy:29109} Polyphagia:{dwwyes:29172} No results found for: "HGBA1C" No results found for: "INSULIN"  Plan: {dwwmed:29123} {dwwpharmacotherapy:29109}   2. ***  3. Obesity Treatment / Action Plan:  {EMobesityactionplanscribe:28314::"Patient will work on garnering support from family and friends to begin weight loss journey.","Will work on eliminating or reducing the presence of highly palatable, calorie dense foods in the home.","Will complete provided nutritional and psychosocial assessment questionnaire before the next appointment.","Will be scheduled for indirect calorimetry to determine resting energy expenditure in a fasting state.  This will allow [place] to create a reduced calorie, high-protein meal plan to promote loss of fat mass while preserving muscle mass.","Counseled on the health benefits of losing 5%-15% of total body weight.","Was counseled on nutritional approaches to weight loss and benefits of reducing processed foods and consuming plant-based foods and high quality protein as part of nutritional weight management.","Was counseled on pharmacotherapy and role as an adjunct in weight management. "}  Obesity Education Performed Today:  We discussed obesity as a disease and the importance of a more detailed evaluation of all the factors contributing to the disease.  We discussed the importance of long term lifestyle changes which include nutrition, exercise and behavioral modifications as well as the importance of customizing this to her specific health and social needs.  We  discussed the benefits of reaching a healthier weight to alleviate the symptoms of existing conditions and reduce the risks of the biomechanical, metabolic and psychological effects of obesity.  [name] appears to be in the action stage of change and states they are ready to start intensive lifestyle modifications and behavioral modifications.  ______________________________________________________________________________  She will be contacted by [facility] to set up initial appointment and the first follow up appointment with a physician.   The following office policies were discussed. She voiced understanding: - Patient will be considered late at 6 minutes past appointment time.   - For the first office visit, patient needs to arrive 1 hour early, fasting except for water. Patient should arrive 15 minutes early for all other visits. -  Patient will bring completed new patient paperwork to first office visit.  If not, appointment will be rescheduled.  30 minutes was spent today on this visit including the above counseling, pre-visit chart review, and post-visit documentation.  Reviewed by clinician on day of visit: allergies, medications, problem list, medical history, surgical history, family history, social history, and previous encounter notes pertinent to obesity diagnosis.    Jesse Sans, FNP

## 2022-10-24 ENCOUNTER — Encounter (INDEPENDENT_AMBULATORY_CARE_PROVIDER_SITE_OTHER): Payer: Self-pay | Admitting: Family Medicine

## 2022-10-24 ENCOUNTER — Encounter (INDEPENDENT_AMBULATORY_CARE_PROVIDER_SITE_OTHER): Payer: Self-pay

## 2022-10-24 DIAGNOSIS — Z6830 Body mass index (BMI) 30.0-30.9, adult: Secondary | ICD-10-CM

## 2022-10-24 DIAGNOSIS — I10 Essential (primary) hypertension: Secondary | ICD-10-CM | POA: Insufficient documentation

## 2022-10-24 DIAGNOSIS — E669 Obesity, unspecified: Secondary | ICD-10-CM | POA: Insufficient documentation

## 2022-10-24 DIAGNOSIS — R7303 Prediabetes: Secondary | ICD-10-CM | POA: Insufficient documentation

## 2023-05-24 ENCOUNTER — Telehealth (HOSPITAL_COMMUNITY): Payer: Self-pay | Admitting: Psychiatry

## 2023-05-24 NOTE — Telephone Encounter (Signed)
D:  Pt phoned inquiring about virtual MH-IOP.  States she was in person MH-IOP in 2008.  A:  Oriented pt.  Pt states she will be following up with her insurance company and will get back to the case manager.  R:  Pt receptive.
